# Patient Record
Sex: Male | Born: 1990 | Race: Black or African American | Hispanic: No | Marital: Single | Smoking: Current every day smoker
Health system: Southern US, Community
[De-identification: ages and names within clinical notes are randomized; demographics above are authoritative.]

## PROBLEM LIST (undated history)

## (undated) ENCOUNTER — Emergency Department

## (undated) DIAGNOSIS — T7840XA Allergy, unspecified, initial encounter: Secondary | ICD-10-CM

## (undated) DIAGNOSIS — F32A Depression, unspecified: Secondary | ICD-10-CM

## (undated) DIAGNOSIS — F419 Anxiety disorder, unspecified: Secondary | ICD-10-CM

## (undated) HISTORY — DX: Depression, unspecified: F32.A

## (undated) HISTORY — DX: Anxiety disorder, unspecified: F41.9

## (undated) HISTORY — DX: Allergy, unspecified, initial encounter: T78.40XA

---
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
## 2011-01-18 ENCOUNTER — Emergency Department: Payer: Self-pay | Admitting: Emergency Medicine

## 2012-02-10 ENCOUNTER — Emergency Department: Payer: Self-pay | Admitting: Emergency Medicine

## 2012-02-19 ENCOUNTER — Emergency Department: Payer: Self-pay | Admitting: Unknown Physician Specialty

## 2012-04-02 ENCOUNTER — Emergency Department: Payer: Self-pay | Admitting: Emergency Medicine

## 2013-06-08 ENCOUNTER — Emergency Department: Payer: Self-pay | Admitting: Emergency Medicine

## 2013-06-10 LAB — BETA STREP CULTURE(ARMC)

## 2014-09-28 ENCOUNTER — Emergency Department
Admission: EM | Admit: 2014-09-28 | Discharge: 2014-09-28 | Disposition: A | Discharge status: Home / Self Care | Payer: Self-pay | Attending: Emergency Medicine | Admitting: Emergency Medicine

## 2014-09-28 ENCOUNTER — Emergency Department: Payer: Self-pay

## 2014-09-28 ENCOUNTER — Encounter: Payer: Self-pay | Admitting: General Practice

## 2014-09-28 DIAGNOSIS — S70211A Abrasion, right hip, initial encounter: Secondary | ICD-10-CM | POA: Insufficient documentation

## 2014-09-28 DIAGNOSIS — Y9389 Activity, other specified: Secondary | ICD-10-CM | POA: Insufficient documentation

## 2014-09-28 DIAGNOSIS — S50312A Abrasion of left elbow, initial encounter: Secondary | ICD-10-CM | POA: Insufficient documentation

## 2014-09-28 DIAGNOSIS — Z720 Tobacco use: Secondary | ICD-10-CM | POA: Insufficient documentation

## 2014-09-28 DIAGNOSIS — Y998 Other external cause status: Secondary | ICD-10-CM | POA: Insufficient documentation

## 2014-09-28 DIAGNOSIS — S80812A Abrasion, left lower leg, initial encounter: Secondary | ICD-10-CM | POA: Insufficient documentation

## 2014-09-28 DIAGNOSIS — S9032XA Contusion of left foot, initial encounter: Secondary | ICD-10-CM | POA: Insufficient documentation

## 2014-09-28 DIAGNOSIS — S93602A Unspecified sprain of left foot, initial encounter: Secondary | ICD-10-CM | POA: Insufficient documentation

## 2014-09-28 DIAGNOSIS — Y9289 Other specified places as the place of occurrence of the external cause: Secondary | ICD-10-CM | POA: Insufficient documentation

## 2014-09-28 MED ORDER — IBUPROFEN 600 MG PO TABS: 600.0000 mg | ORAL_TABLET | Freq: Three times a day (TID) | ORAL | Status: DC | PRN

## 2014-09-28 MED ORDER — TRAMADOL HCL 50 MG PO TABS: 50.0000 mg | ORAL_TABLET | Freq: Three times a day (TID) | ORAL | Status: DC | PRN
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
## 2014-09-28 NOTE — Discharge Instructions (Signed)
Apply ice and elevate. Take medication as prescribed. Use crutches and Ace wrap for 2-3 days or as long as pain continues.  Follow-up with orthopedic next week for continued pain.  Return to the ER for new or worsening concerns.  Contusion A contusion is a deep bruise. Contusions are the result of an injury that caused bleeding under the skin. The contusion may turn blue, purple, or yellow. Minor injuries will give you a painless contusion, but more severe contusions may stay painful and swollen for a few weeks.  CAUSES  A contusion is usually caused by a blow, trauma, or direct force to an area of the body. SYMPTOMS   Swelling and redness of the injured area.  Bruising of the injured area.  Tenderness and soreness of the injured area.  Pain. DIAGNOSIS  The diagnosis can be made by taking a history and physical exam. An X-ray, CT scan, or MRI may be needed to determine if there were any associated injuries, such as fractures. TREATMENT  Specific treatment will depend on what area of the body was injured. In general, the best treatment for a contusion is resting, icing, elevating, and applying cold compresses to the injured area. Over-the-counter medicines may also be recommended for pain control. Ask your caregiver what the best treatment is for your contusion. HOME CARE INSTRUCTIONS   Put ice on the injured area.  Put ice in a plastic bag.  Place a towel between your skin and the bag.  Leave the ice on for 15-20 minutes, 3-4 times a day, or as directed by your health care provider.  Only take over-the-counter or prescription medicines for pain, discomfort, or fever as directed by your caregiver. Your caregiver may recommend avoiding anti-inflammatory medicines (aspirin, ibuprofen, and naproxen) for 48 hours because these medicines may increase bruising.  Rest the injured area.  If possible, elevate the injured area to reduce swelling. SEEK IMMEDIATE MEDICAL CARE IF:   You  have increased bruising or swelling.  You have pain that is getting worse.  Your swelling or pain is not relieved with medicines. MAKE SURE YOU:   Understand these instructions.  Will watch your condition.  Will get help right away if you are not doing well or get worse. Document Released: 12/16/2004 Document Revised: 03/13/2013 Document Reviewed: 01/11/2011 [facility] Patient Information 2015 [place], [place]. This information is not intended to replace advice given to you by your health care provider. Make sure you discuss any questions you have with your health care provider.  Foot Sprain The muscles and cord like structures which attach muscle to bone (tendons) that surround the feet are made up of units. A foot sprain can occur at the weakest spot in any of these units. This condition is most often caused by injury to or overuse of the foot, as from playing contact sports, or aggravating a previous injury, or from poor conditioning, or obesity. SYMPTOMS  Pain with movement of the foot.  Tenderness and swelling at the injury site.  Loss of strength is present in moderate or severe sprains. THE THREE GRADES OR SEVERITY OF FOOT SPRAIN ARE:  Mild (Grade I): Slightly pulled muscle without tearing of muscle or tendon fibers or loss of strength.  Moderate (Grade II): Tearing of fibers in a muscle, tendon, or at the attachment to bone, with small decrease in strength.  Severe (Grade III): Rupture of the muscle-tendon-bone attachment, with separation of fibers. Severe sprain requires surgical repair. Often repeating (chronic) sprains are caused by overuse.  Sudden (acute) sprains are caused by direct injury or over-use. DIAGNOSIS  Diagnosis of this condition is usually by your own observation. If problems continue, a caregiver may be required for further evaluation and treatment. X-rays may be required to make sure there are not breaks in the bones (fractures) present. Continued problems may  require physical therapy for treatment. PREVENTION  Use strength and conditioning exercises appropriate for your sport.  Warm up properly prior to working out.  Use athletic shoes that are made for the sport you are participating in.  Allow adequate time for healing. Early return to activities makes repeat injury more likely, and can lead to an unstable arthritic foot that can result in prolonged disability. Mild sprains generally heal in 3 to 10 days, with moderate and severe sprains taking 2 to 10 weeks. Your caregiver can help you determine the proper time required for healing. HOME CARE INSTRUCTIONS   Apply ice to the injury for 15-20 minutes, 03-04 times per day. Put the ice in a plastic bag and place a towel between the bag of ice and your skin.  An elastic wrap (like an Ace bandage) may be used to keep swelling down.  Keep foot above the level of the heart, or at least raised on a footstool, when swelling and pain are present.  Try to avoid use other than gentle range of motion while the foot is painful. Do not resume use until instructed by your caregiver. Then begin use gradually, not increasing use to the point of pain. If pain does develop, decrease use and continue the above measures, gradually increasing activities that do not cause discomfort, until you gradually achieve normal use.  Use crutches if and as instructed, and for the length of time instructed.  Keep injured foot and ankle wrapped between treatments.  Massage foot and ankle for comfort and to keep swelling down. Massage from the toes up towards the knee.  Only take over-the-counter or prescription medicines for pain, discomfort, or fever as directed by your caregiver. SEEK IMMEDIATE MEDICAL CARE IF:   Your pain and swelling increase, or pain is not controlled with medications.  You have loss of feeling in your foot or your foot turns cold or blue.  You develop new, unexplained symptoms, or an increase of the  symptoms that brought you to your caregiver. MAKE SURE YOU:   Understand these instructions.  Will watch your condition.  Will get help right away if you are not doing well or get worse. Document Released: 08/28/2001 Document Revised: 05/31/2011 Document Reviewed: 10/26/2007 [facility] Patient Information 2015 [place], [place]. This information is not intended to replace advice given to you by your health care provider. Make sure you discuss any questions you have with your health care provider.
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
## 2014-12-22 ENCOUNTER — Emergency Department
Admission: EM | Admit: 2014-12-22 | Discharge: 2014-12-22 | Disposition: A | Discharge status: Home / Self Care | Payer: Self-pay

## 2014-12-22 NOTE — ED Notes (Signed)
Called for patient, no answer x2

## 2014-12-22 NOTE — ED Notes (Signed)
Called for patient to be triaged, no answer at this time. 3rd time called.

## 2014-12-22 NOTE — ED Notes (Signed)
Called for patient, no answer.

## 2014-12-24 ENCOUNTER — Emergency Department
Admission: EM | Admit: 2014-12-24 | Discharge: 2014-12-24 | Disposition: A | Discharge status: Home / Self Care | Payer: Self-pay | Attending: Emergency Medicine | Admitting: Emergency Medicine

## 2014-12-24 ENCOUNTER — Encounter: Payer: Self-pay | Admitting: Emergency Medicine

## 2014-12-24 ENCOUNTER — Emergency Department: Payer: Self-pay

## 2014-12-24 DIAGNOSIS — R319 Hematuria, unspecified: Secondary | ICD-10-CM | POA: Insufficient documentation

## 2014-12-24 DIAGNOSIS — R51 Headache: Secondary | ICD-10-CM | POA: Insufficient documentation

## 2014-12-24 DIAGNOSIS — R110 Nausea: Secondary | ICD-10-CM | POA: Insufficient documentation

## 2014-12-24 DIAGNOSIS — R52 Pain, unspecified: Secondary | ICD-10-CM

## 2014-12-24 DIAGNOSIS — Z720 Tobacco use: Secondary | ICD-10-CM | POA: Insufficient documentation

## 2014-12-24 DIAGNOSIS — R519 Headache, unspecified: Secondary | ICD-10-CM

## 2014-12-24 DIAGNOSIS — R1084 Generalized abdominal pain: Secondary | ICD-10-CM | POA: Insufficient documentation

## 2014-12-24 LAB — CBC
HEMATOCRIT: 45.9 % (ref 40.0–52.0)
Hemoglobin: 14.8 g/dL (ref 13.0–18.0)
MCH: 22.6 pg — ABNORMAL LOW (ref 26.0–34.0)
MCHC: 32.2 g/dL (ref 32.0–36.0)
MCV: 70.2 fL — AB (ref 80.0–100.0)
Platelets: 350 10*3/uL (ref 150–440)
RBC: 6.54 MIL/uL — AB (ref 4.40–5.90)
RDW: 15.4 % — ABNORMAL HIGH (ref 11.5–14.5)
WBC: 11.8 10*3/uL — AB (ref 3.8–10.6)

## 2014-12-24 LAB — COMPREHENSIVE METABOLIC PANEL
ALT: 22 U/L (ref 17–63)
AST: 45 U/L — AB (ref 15–41)
Albumin: 4.7 g/dL (ref 3.5–5.0)
Alkaline Phosphatase: 63 U/L (ref 38–126)
Anion gap: 7 (ref 5–15)
BUN: 14 mg/dL (ref 6–20)
CHLORIDE: 100 mmol/L — AB (ref 101–111)
CO2: 29 mmol/L (ref 22–32)
Calcium: 9.6 mg/dL (ref 8.9–10.3)
Creatinine, Ser: 1.11 mg/dL (ref 0.61–1.24)
Glucose, Bld: 157 mg/dL — ABNORMAL HIGH (ref 65–99)
POTASSIUM: 4.3 mmol/L (ref 3.5–5.1)
Sodium: 136 mmol/L (ref 135–145)
Total Bilirubin: 2.2 mg/dL — ABNORMAL HIGH (ref 0.3–1.2)
Total Protein: 7.7 g/dL (ref 6.5–8.1)

## 2014-12-24 LAB — URINALYSIS COMPLETE WITH MICROSCOPIC (ARMC ONLY)
BACTERIA UA: NONE SEEN
Bilirubin Urine: NEGATIVE
Glucose, UA: NEGATIVE mg/dL
LEUKOCYTES UA: NEGATIVE
Nitrite: NEGATIVE
PH: 6 (ref 5.0–8.0)
Protein, ur: 30 mg/dL — AB
Specific Gravity, Urine: 1.028 (ref 1.005–1.030)

## 2014-12-24 LAB — LIPASE, BLOOD: LIPASE: 19 U/L — AB (ref 22–51)

## 2014-12-24 NOTE — Discharge Instructions (Signed)
Abdominal Pain Many things can cause abdominal pain. Usually, abdominal pain is not caused by a disease and will improve without treatment. It can often be observed and treated at home. Your health care provider will do a physical exam and possibly order blood tests and X-rays to help determine the seriousness of your pain. However, in many cases, more time must pass before a clear cause of the pain can be found. Before that point, your health care provider may not know if you need more testing or further treatment. HOME CARE INSTRUCTIONS  Monitor your abdominal pain for any changes. The following actions may help to alleviate any discomfort you are experiencing:  Only take over-the-counter or prescription medicines as directed by your health care provider.  Do not take laxatives unless directed to do so by your health care provider.  Try a clear liquid diet (broth, tea, or water) as directed by your health care provider. Slowly move to a bland diet as tolerated. SEEK MEDICAL CARE IF:  You have unexplained abdominal pain.  You have abdominal pain associated with nausea or diarrhea.  You have pain when you urinate or have a bowel movement.  You experience abdominal pain that wakes you in the night.  You have abdominal pain that is worsened or improved by eating food.  You have abdominal pain that is worsened with eating fatty foods.  You have a fever. SEEK IMMEDIATE MEDICAL CARE IF:   Your pain does not go away within 2 hours.  You keep throwing up (vomiting).  Your pain is felt only in portions of the abdomen, such as the right side or the left lower portion of the abdomen.  You pass bloody or black tarry stools. MAKE SURE YOU:  Understand these instructions.   Will watch your condition.   Will get help right away if you are not doing well or get worse.  Document Released: 12/16/2004 Document Revised: 03/13/2013 Document Reviewed: 11/15/2012 [facility] Patient Information  2015 [place], [place]. This information is not intended to replace advice given to you by your health care provider. Make sure you discuss any questions you have with your health care provider. Please return for fever vomiting worse pain or burning when you urinate or any other complaints. Please remember to follow-up with the urologist to evaluate the blood in your urine that should not be there especially your age. Use Tylenol for the time being for the headache and other pains.

## 2014-12-24 NOTE — ED Notes (Signed)
Pt to ED with c/o headache, nausea and generalized abd. Pain since yesterday

## 2014-12-24 NOTE — ED Provider Notes (Signed)
[facility] Emergency Department Provider Note  ____________________________________________  Time seen: Approximately 2:50 PM  I have reviewed the triage vital signs and the nursing notes.   HISTORY  Chief Complaint Abdominal Pain and Headache    HPI George Barrera is a 24 y.o. male who reports he had a throbbing frontal headache for the last 3 days. It is not changed by position he doesn't have any stuffy nose just came on and is been fairly mild to moderate. Patient reports he does not usually have headaches although has occasional headaches. This headache is not severe. Patient does not feel he had a fever although his wife said he felt warm last night. Patient also has a complaint of diffuse abdominal pain and nausea. That came on this morning.Patient denies any other complaints abdominal pain is moderate in nature crampy as I said does not appear to change with any activity or eating. Patient reports he really hasn't eaten anything except for very small meal for breakfast yesterday.   History reviewed. No pertinent past medical history.  There are no active problems to display for this patient.   History reviewed. No pertinent past surgical history.  Current Outpatient Rx  Name  Route  Sig  Dispense  Refill  . ibuprofen (ADVIL,MOTRIN) 600 MG tablet   Oral   Take 1 tablet (600 mg total) by mouth every 8 (eight) hours as needed for mild pain or moderate pain.   15 tablet   0   . traMADol (ULTRAM) 50 MG tablet   Oral   Take 1 tablet (50 mg total) by mouth every 8 (eight) hours as needed (Do not drive or operate machinery while taking as can cause drowsiness.).   12 tablet   0     Allergies Review of patient's allergies indicates no known allergies.  No family history on file.  Social History Social History  Substance Use Topics  . Smoking status: Current Every Day Smoker -- 0.50 packs/day    Types: Cigarettes  . Smokeless tobacco: None   . Alcohol Use: No    Review of Systems Constitutional: No fever/chills see history of present illness Eyes: No visual changes. ENT: No sore throat. Cardiovascular: Denies chest pain. Respiratory: Denies shortness of breath. Gastrointestinal: no vomiting.  No diarrhea.  No constipation. Genitourinary: Negative for dysuria. Musculoskeletal: Negative for back pain. Skin: Negative for rash. Neurological: Negative for headaches, focal weakness or numbness.  10-point ROS otherwise negative.  ____________________________________________   PHYSICAL EXAM:  VITAL SIGNS: ED Triage Vitals  Enc Vitals Group     BP 12/24/14 1344 135/110 mmHg     Pulse Rate 12/24/14 1344 105     Resp 12/24/14 1344 18     Temp 12/24/14 1344 98.1 F (36.7 C)     Temp Source 12/24/14 1344 Oral     SpO2 12/24/14 1344 99 %     Weight 12/24/14 1344 145 lb (65.772 kg)     Height 12/24/14 1344  (1.778 m)     Head Cir --      Peak Flow --      Pain Score 12/24/14 1345 6     Pain Loc --      Pain Edu? --      Excl. in GC? --     Constitutional: Alert and oriented. Well appearing and in no acute distress. Eyes: Conjunctivae are normal. PERRL. EOMI. fundi look normal bilaterally Head: Atraumatic. Nose: No congestion/rhinnorhea. Mouth/Throat: Mucous membranes are moist.  Oropharynx non-erythematous. Neck: No stridor.   Cardiovascular: Normal rate, regular rhythm. Grossly normal heart sounds.  Good peripheral circulation. Respiratory: Normal respiratory effort.  No retractions. Lungs CTAB. Gastrointestinal: Soft and nontender. No distention. No abdominal bruits. No CVA tenderness. Musculoskeletal: No lower extremity tenderness nor edema.  No joint effusions. Neurologic:  Normal speech and language. No gross focal neurologic deficits are appreciated. No gait instability. Skin:  Skin is warm, dry and intact. No rash noted. There is no abrasion on the right shoulder blade patient reports he got that from  playing with his dog. Psychiatric: Mood and affect are normal. Speech and behavior are normal.  ____________________________________________   LABS (all labs ordered are listed, but only abnormal results are displayed)  Labs Reviewed  COMPREHENSIVE METABOLIC PANEL - Abnormal; Notable for the following:    Chloride 100 (*)    Glucose, Bld 157 (*)    AST 45 (*)    Total Bilirubin 2.2 (*)    All other components within normal limits  CBC - Abnormal; Notable for the following:    WBC 11.8 (*)    RBC 6.54 (*)    MCV 70.2 (*)    MCH 22.6 (*)    RDW 15.4 (*)    All other components within normal limits  URINALYSIS COMPLETEWITH MICROSCOPIC ([facility] ONLY) - Abnormal; Notable for the following:    Color, Urine YELLOW (*)    APPearance CLEAR (*)    Ketones, ur 1+ (*)    Hgb urine dipstick 3+ (*)    Protein, ur 30 (*)    Squamous Epithelial / LPF 0-5 (*)    All other components within normal limits  LIPASE, BLOOD - Abnormal; Notable for the following:    Lipase 19 (*)    All other components within normal limits   ____________________________________________  EKG   ____________________________________________  RADIOLOGY   ____________________________________________   PROCEDURES   ____________________________________________   INITIAL IMPRESSION / ASSESSMENT AND PLAN / ED COURSE  Pertinent labs & imaging results that were available during my care of the patient were reviewed by me and considered in my medical decision making (see chart for details). Discussed in detail with the patient need to follow-up for hematuria I also discussed with him and his significant other the need to have his blood pressure followed because it was considerably higher than it should've and in triage although here it's been in the 140s last reading was 143/78. Patient's fundi look well not sure why he is having hematuria wouldn't expect hematuria with blood pressure in the 140s hypertension,  Especially with a normal creatinine. Patient's headache is gone after he ate.  ____________________________________________   FINAL CLINICAL IMPRESSION(S) / ED DIAGNOSES  Final diagnoses:  Pain  Nonintractable episodic headache, unspecified headache type  Hematuria      Arnaldo Natal, MD 12/24/14 [phone]

## 2014-12-28 ENCOUNTER — Encounter: Payer: Self-pay | Admitting: *Deleted

## 2014-12-28 ENCOUNTER — Emergency Department
Admission: EM | Admit: 2014-12-28 | Discharge: 2014-12-29 | Disposition: A | Discharge status: Home / Self Care | Payer: Self-pay | Attending: Emergency Medicine | Admitting: Emergency Medicine

## 2014-12-28 DIAGNOSIS — R519 Headache, unspecified: Secondary | ICD-10-CM

## 2014-12-28 DIAGNOSIS — R110 Nausea: Secondary | ICD-10-CM | POA: Insufficient documentation

## 2014-12-28 DIAGNOSIS — R51 Headache: Secondary | ICD-10-CM | POA: Insufficient documentation

## 2014-12-28 DIAGNOSIS — Z720 Tobacco use: Secondary | ICD-10-CM | POA: Insufficient documentation

## 2014-12-28 DIAGNOSIS — R319 Hematuria, unspecified: Secondary | ICD-10-CM | POA: Insufficient documentation

## 2014-12-28 DIAGNOSIS — M549 Dorsalgia, unspecified: Secondary | ICD-10-CM | POA: Insufficient documentation

## 2014-12-28 LAB — URINALYSIS COMPLETE WITH MICROSCOPIC (ARMC ONLY)
Bilirubin Urine: NEGATIVE
Glucose, UA: NEGATIVE mg/dL
Ketones, ur: NEGATIVE mg/dL
Leukocytes, UA: NEGATIVE
Nitrite: NEGATIVE
PROTEIN: NEGATIVE mg/dL
Specific Gravity, Urine: 1.02 (ref 1.005–1.030)
WBC, UA: NONE SEEN WBC/hpf (ref 0–5)
pH: 6 (ref 5.0–8.0)

## 2014-12-28 MED ORDER — PROMETHAZINE HCL 25 MG PO TABS
12.5000 mg | ORAL_TABLET | Freq: Once | ORAL | Status: AC
  Administered 2014-12-28: 12.5 mg via ORAL
  Filled 2014-12-28: qty 1

## 2014-12-28 MED ORDER — OXYCODONE-ACETAMINOPHEN 5-325 MG PO TABS
1.0000 | ORAL_TABLET | Freq: Once | ORAL | Status: AC
Start: 2014-12-28 — End: 2014-12-28
  Administered 2014-12-28: 1 via ORAL
  Filled 2014-12-28: qty 1

## 2014-12-28 NOTE — ED Notes (Signed)
Pt reports headache and back pain since Sunday. Tried OTC Tylenol without relief.

## 2014-12-29 MED ORDER — PROMETHAZINE HCL 12.5 MG PO TABS: 12.5000 mg | ORAL_TABLET | Freq: Three times a day (TID) | ORAL | Status: DC | PRN

## 2014-12-29 NOTE — Discharge Instructions (Signed)
General Headache Without Cause A headache is pain or discomfort felt around the head or neck area. There are many causes and types of headaches. In some cases, the cause may not be found.  HOME CARE  Managing Pain  Take over-the-counter and prescription medicines only as told by your doctor.  Lie down in a dark, quiet room when you have a headache.  If directed, apply ice to the head and neck area:  Put ice in a plastic bag.  Place a towel between your skin and the bag.  Leave the ice on for 20 minutes, 2-3 times per day.  Use a heating pad or hot shower to apply heat to the head and neck area as told by your doctor.  Keep lights dim if bright lights bother you or make your headaches worse. Eating and Drinking  Eat meals on a regular schedule.  Lessen how much alcohol you drink.  Lessen how much caffeine you drink, or stop drinking caffeine. General Instructions  Keep all follow-up visits as told by your doctor. This is important.  Keep a journal to find out if certain things bring on headaches. For example, write down:  What you eat and drink.  How much sleep you get.  Any change to your diet or medicines.  Relax by getting a massage or doing other relaxing activities.  Lessen stress.  Sit up straight. Do not tighten (tense) your muscles.  Do not use tobacco products. This includes cigarettes, chewing tobacco, or e-cigarettes. If you need help quitting, ask your doctor.  Exercise regularly as told by your doctor.  Get enough sleep. This often means 7-9 hours of sleep. GET HELP IF:  Your symptoms are not helped by medicine.  You have a headache that feels different than the other headaches.  You feel sick to your stomach (nauseous) or you throw up (vomit).  You have a fever. GET HELP RIGHT AWAY IF:   Your headache becomes really bad.  You keep throwing up.  You have a stiff neck.  You have trouble seeing.  You have trouble speaking.  You have  pain in the eye or ear.  Your muscles are weak or you lose muscle control.  You lose your balance or have trouble walking.  You feel like you will pass out (faint) or you pass out.  You have confusion.   This information is not intended to replace advice given to you by your health care provider. Make sure you discuss any questions you have with your health care provider.   Document Released: 12/16/2007 Document Revised: 11/27/2014 Document Reviewed: 07/01/2014 Elsevier Interactive Patient Education 2016 [employer]  Hematuria, Adult Hematuria is blood in your urine. It can be caused by a bladder infection, kidney infection, prostate infection, kidney stone, or cancer of your urinary tract. Infections can usually be treated with medicine, and a kidney stone usually will pass through your urine. If neither of these is the cause of your hematuria, further workup to find out the reason may be needed. It is very important that you tell your health care provider about any blood you see in your urine, even if the blood stops without treatment or happens without causing pain. Blood in your urine that happens and then stops and then happens again can be a symptom of a very serious condition. Also, pain is not a symptom in the initial stages of many urinary cancers. HOME CARE INSTRUCTIONS   Drink lots of fluid, 3-4 quarts a day.  If you have been diagnosed with an infection, cranberry juice is especially recommended, in addition to large amounts of water.  Avoid caffeine, tea, and carbonated beverages because they tend to irritate the bladder.  Avoid alcohol because it may irritate the prostate.  Take all medicines as directed by your health care provider.  If you were prescribed an antibiotic medicine, finish it all even if you start to feel better.  If you have been diagnosed with a kidney stone, follow your health care provider's instructions regarding straining your urine to catch the  stone.  Empty your bladder often. Avoid holding urine for long periods of time.  After a bowel movement, women should cleanse front to back. Use each tissue only once.  Empty your bladder before and after sexual intercourse if you are a male. SEEK MEDICAL CARE IF:  You develop back pain.  You have a fever.  You have a feeling of sickness in your stomach (nausea) or vomiting.  Your symptoms are not better in 3 days. Return sooner if you are getting worse. SEEK IMMEDIATE MEDICAL CARE IF:   You develop severe vomiting and are unable to keep the medicine down.  You develop severe back or abdominal pain despite taking your medicines.  You begin passing a large amount of blood or clots in your urine.  You feel extremely weak or faint, or you pass out. MAKE SURE YOU:   Understand these instructions.  Will watch your condition.  Will get help right away if you are not doing well or get worse.   This information is not intended to replace advice given to you by your health care provider. Make sure you discuss any questions you have with your health care provider.   Document Released: 03/08/2005 Document Revised: 03/29/2014 Document Reviewed: 11/06/2012 Elsevier Interactive Patient Education [employer]

## 2014-12-29 NOTE — ED Provider Notes (Signed)
CSN: [id number]     Arrival date & time 12/28/14  2214 History   First MD Initiated Contact with Patient 12/28/14 2301     Chief Complaint  Patient presents with  . Headache  . Back Pain     (Consider location/radiation/quality/duration/timing/severity/associated sxs/prior Treatment) HPI  24 year old male presents to the emergency department for evaluation of headache with blood in urine. He was evaluated at the emergency department 4 days ago, CBC, BMP CT of the head, CT abdomen pelvis for stones were all negative. Urinalysis did show hematuria with no infection. Today, patient complains of continued headache, blood in urine as well as lower back pain. Patient states hematuria and back pain have been improving.  His headache is moderate and is his chief complaint. Headache is frontal, described as throbbing. He has a history of migraine headaches. No relief with Tylenol. He denies any worsening headache symptoms over the last 4 days. Mild nausea without vomiting. He is tolerating by mouth well. Drinking lots of fluids. He denies any fevers, neck pain, abdominal pain, no penal discharge.  History reviewed. No pertinent past medical history. History reviewed. No pertinent past surgical history. No family history on file. Social History  Substance Use Topics  . Smoking status: Current Every Day Smoker -- 0.50 packs/day    Types: Cigarettes  . Smokeless tobacco: None  . Alcohol Use: No    Review of Systems  Constitutional: Negative.  Negative for fever, chills, activity change and appetite change.  HENT: Negative for congestion, ear pain, mouth sores, rhinorrhea, sinus pressure, sore throat and trouble swallowing.   Eyes: Negative for photophobia, pain and discharge.  Respiratory: Negative for cough, chest tightness and shortness of breath.   Cardiovascular: Negative for chest pain and leg swelling.  Gastrointestinal: Positive for nausea. Negative for vomiting, abdominal pain, diarrhea  and abdominal distention.  Genitourinary: Positive for hematuria. Negative for dysuria, urgency, frequency, flank pain, decreased urine volume, discharge, difficulty urinating and testicular pain.  Musculoskeletal: Negative for back pain, arthralgias and gait problem.  Skin: Negative for color change and rash.  Neurological: Positive for headaches. Negative for dizziness.  Hematological: Negative for adenopathy.  Psychiatric/Behavioral: Negative for behavioral problems and agitation.      Allergies  Review of patient's allergies indicates no known allergies.  Home Medications   Prior to Admission medications   Medication Sig Start Date End Date Taking? Authorizing Provider  ibuprofen (ADVIL,MOTRIN) 600 MG tablet Take 1 tablet (600 mg total) by mouth every 8 (eight) hours as needed for mild pain or moderate pain. 09/28/14   Renford Dills, NP  promethazine (PHENERGAN) 12.5 MG tablet Take 1 tablet (12.5 mg total) by mouth every 8 (eight) hours as needed for nausea or vomiting. 12/29/14   Evon Slack, PA-C  traMADol (ULTRAM) 50 MG tablet Take 1 tablet (50 mg total) by mouth every 8 (eight) hours as needed (Do not drive or operate machinery while taking as can cause drowsiness.). 09/28/14   Renford Dills, NP   BP 148/86 mmHg  Pulse 86  Temp(Src) 97.6 F (36.4 C) (Oral)  Resp 16  Ht  (1.753 m)  Wt 146 lb (66.225 kg)  BMI 21.55 kg/m2  SpO2 100% Physical Exam  Constitutional: He is oriented to person, place, and time. He appears well-developed and well-nourished.  HENT:  Head: Normocephalic and atraumatic.  Eyes: Conjunctivae and EOM are normal. Pupils are equal, round, and reactive to light.  Neck: Normal range of motion. Neck supple.  Cardiovascular: Normal  rate, regular rhythm, normal heart sounds and intact distal pulses.   Pulmonary/Chest: Effort normal and breath sounds normal. No respiratory distress. He has no wheezes. He has no rales. He exhibits no tenderness.   Abdominal: Soft. Bowel sounds are normal. He exhibits no distension and no mass. There is no tenderness. There is no guarding.  Musculoskeletal: Normal range of motion. He exhibits no edema or tenderness.  Neurological: He is alert and oriented to person, place, and time. No cranial nerve deficit. Coordination normal.  Skin: Skin is warm and dry.  Psychiatric: He has a normal mood and affect. His behavior is normal. Judgment and thought content normal.    ED Course  Procedures (including critical care time) Labs Review Labs Reviewed  URINALYSIS COMPLETEWITH MICROSCOPIC ([facility] ONLY) - Abnormal; Notable for the following:    Color, Urine YELLOW (*)    APPearance HAZY (*)    Hgb urine dipstick 2+ (*)    Bacteria, UA RARE (*)    Squamous Epithelial / LPF 0-5 (*)    All other components within normal limits    Imaging Review No results found. I have personally reviewed and evaluated these images and lab results as part of my medical decision-making.   EKG Interpretation None      MDM   Final diagnoses:  Hematuria  Nonintractable headache, unspecified chronicity pattern, unspecified headache type    24 year old male since the emergency department for continued headache and hematuria has been present for 1 week. He was evaluated 4 days ago here at the emergency department where CT of the head, CT of the abdomen and pelvis for stones, CBC, BMP, UA were obtained. Urinalysis showed hematuria that has improved based on today's urinalysis. Patient was given 12.5 mg of fever and by mouth, headache improved to 3 out of 10 at time of discharge. Patient requested work note for the next 2 days. He was urged to follow-up with urology first thing Monday morning. If any worsening symptoms or urgent changes in the patient's health, patient is to return to the ER.    Evon Slack, PA-C 12/29/14 1610  Rockne Menghini, MD 12/29/14 1901

## 2015-09-30 ENCOUNTER — Emergency Department
Admission: EM | Admit: 2015-09-30 | Discharge: 2015-09-30 | Disposition: A | Discharge status: Home / Self Care | Payer: Self-pay | Attending: Emergency Medicine | Admitting: Emergency Medicine

## 2015-09-30 DIAGNOSIS — J039 Acute tonsillitis, unspecified: Secondary | ICD-10-CM | POA: Insufficient documentation

## 2015-09-30 DIAGNOSIS — J020 Streptococcal pharyngitis: Secondary | ICD-10-CM

## 2015-09-30 DIAGNOSIS — F17210 Nicotine dependence, cigarettes, uncomplicated: Secondary | ICD-10-CM | POA: Insufficient documentation

## 2015-09-30 LAB — POCT RAPID STREP A: STREPTOCOCCUS, GROUP A SCREEN (DIRECT): POSITIVE — AB

## 2015-09-30 MED ORDER — LIDOCAINE VISCOUS 2 % MT SOLN
OROMUCOSAL | Status: AC
  Administered 2015-09-30: 15 mL via OROMUCOSAL
  Filled 2015-09-30: qty 15

## 2015-09-30 MED ORDER — NAPROXEN 500 MG PO TABS: 500.0000 mg | ORAL_TABLET | Freq: Two times a day (BID) | ORAL | Status: DC

## 2015-09-30 MED ORDER — LIDOCAINE VISCOUS 2 % MT SOLN: 20.0000 mL | OROMUCOSAL | Status: DC | PRN

## 2015-09-30 MED ORDER — AMOXICILLIN 500 MG PO TABS: 500.0000 mg | ORAL_TABLET | Freq: Two times a day (BID) | ORAL | Status: DC

## 2015-09-30 MED ORDER — AMOXICILLIN 500 MG PO CAPS
ORAL_CAPSULE | ORAL | Status: AC
  Administered 2015-09-30: 500 mg via ORAL
  Filled 2015-09-30: qty 1

## 2015-09-30 MED ORDER — LIDOCAINE VISCOUS 2 % MT SOLN
15.0000 mL | Freq: Once | OROMUCOSAL | Status: AC
  Administered 2015-09-30: 15 mL via OROMUCOSAL

## 2015-09-30 MED ORDER — AMOXICILLIN 500 MG PO CAPS
500.0000 mg | ORAL_CAPSULE | Freq: Once | ORAL | Status: AC
  Administered 2015-09-30: 500 mg via ORAL

## 2015-09-30 NOTE — Discharge Instructions (Signed)

## 2015-09-30 NOTE — ED Provider Notes (Signed)
[facility] Emergency Department Provider Note  ____________________________________________  Time seen: Approximately 12:27 PM  I have reviewed the triage vital signs and the nursing notes.   HISTORY  Chief Complaint Sore Throat    HPI George Barrera is a 25 y.o. male presents with complaints of sore throat for the last 2 days. Denies any fever chills nausea vomiting. Appetite is okay just hurts to swallow. Patient is an every day smoker.   History reviewed. No pertinent past medical history.  There are no active problems to display for this patient.   History reviewed. No pertinent past surgical history.  Current Outpatient Rx  Name  Route  Sig  Dispense  Refill  . amoxicillin (AMOXIL) 500 MG tablet   Oral   Take 1 tablet (500 mg total) by mouth 2 (two) times daily.   20 tablet   0   . lidocaine (XYLOCAINE) 2 % solution   Mouth/Throat   Use as directed 20 mLs in the mouth or throat as needed for mouth pain.   100 mL   0   . naproxen (NAPROSYN) 500 MG tablet   Oral   Take 1 tablet (500 mg total) by mouth 2 (two) times daily with a meal.   60 tablet   0     Allergies Review of patient's allergies indicates no known allergies.  No family history on file.  Social History Social History  Substance Use Topics  . Smoking status: Current Every Day Smoker -- 0.50 packs/day    Types: Cigarettes  . Smokeless tobacco: None  . Alcohol Use: No    Review of Systems Constitutional: No fever/chills Eyes: No visual changes. ENT: Positive sore throat. Cardiovascular: Denies chest pain. Respiratory: Denies shortness of breath. Musculoskeletal: Negative for back pain. Skin: Negative for rash. Neurological: Negative for headaches, focal weakness or numbness.  10-point ROS otherwise negative.  ____________________________________________   PHYSICAL EXAM:  VITAL SIGNS: ED Triage Vitals  Enc Vitals Group     BP 09/30/15 1221 147/90  mmHg     Pulse Rate 09/30/15 1221 93     Resp 09/30/15 1221 20     Temp 09/30/15 1221 98.6 F (37 C)     Temp Source 09/30/15 1221 Oral     SpO2 09/30/15 1221 97 %     Weight --      Height --      Head Cir --      Peak Flow --      Pain Score 09/30/15 1220 10     Pain Loc --      Pain Edu? --      Excl. in GC? --     Constitutional: Alert and oriented. Well appearing and in no acute distress. Head: Atraumatic. Nose: No congestion/rhinnorhea. Mouth/Throat: Mucous membranes are moist.  Oropharynx non-erythematous. Neck: No stridor. Full range of motion nontender  Cardiovascular: Normal rate, regular rhythm. Grossly normal heart sounds.  Good peripheral circulation. Respiratory: Normal respiratory effort.  No retractions. Lungs CTAB. Musculoskeletal: No lower extremity tenderness nor edema.  No joint effusions. Neurologic:  Normal speech and language. No gross focal neurologic deficits are appreciated. No gait instability. Skin:  Skin is warm, dry and intact. No rash noted. Psychiatric: Mood and affect are normal. Speech and behavior are normal.  ____________________________________________   LABS (all labs ordered are listed, but only abnormal results are displayed)  Labs Reviewed  POCT RAPID STREP A - Abnormal; Notable for the following:  Streptococcus, Group A Screen (Direct) POSITIVE (*)    All other components within normal limits   ____________________________________________  EKG   ____________________________________________  RADIOLOGY   ____________________________________________   PROCEDURES  Procedure(s) performed: None  Critical Care performed: No  ____________________________________________   INITIAL IMPRESSION / ASSESSMENT AND PLAN / ED COURSE  Pertinent labs & imaging results that were available during my care of the patient were reviewed by me and considered in my medical decision making (see chart for details).  Acute exudative  tonsillitis. Rx given for amoxicillin 500 mg 3 times a day, viscous lidocaine. Work excuse 24 hours patient follow-up with PCP or return to ER with any worsening symptomology. ____________________________________________   FINAL CLINICAL IMPRESSION(S) / ED DIAGNOSES  Final diagnoses:  Tonsillitis with exudate  Strep sore throat     This chart was dictated using voice recognition software/Dragon. Despite best efforts to proofread, errors can occur which can change the meaning. Any change was purely unintentional.   Evangeline Dakin, PA-C 09/30/15 1443  Jene Every, MD 09/30/15 [phone]

## 2015-09-30 NOTE — ED Notes (Signed)
Pt c/o sore throat for the past 2 days 

## 2020-05-25 ENCOUNTER — Emergency Department: Payer: Self-pay

## 2020-05-25 ENCOUNTER — Emergency Department
Admission: EM | Admit: 2020-05-25 | Discharge: 2020-05-25 | Disposition: A | Discharge status: Home / Self Care | Payer: Self-pay | Attending: Emergency Medicine | Admitting: Emergency Medicine

## 2020-05-25 ENCOUNTER — Other Ambulatory Visit: Payer: Self-pay

## 2020-05-25 DIAGNOSIS — F17210 Nicotine dependence, cigarettes, uncomplicated: Secondary | ICD-10-CM | POA: Insufficient documentation

## 2020-05-25 DIAGNOSIS — R112 Nausea with vomiting, unspecified: Secondary | ICD-10-CM | POA: Insufficient documentation

## 2020-05-25 DIAGNOSIS — R1033 Periumbilical pain: Secondary | ICD-10-CM | POA: Insufficient documentation

## 2020-05-25 DIAGNOSIS — R197 Diarrhea, unspecified: Secondary | ICD-10-CM | POA: Insufficient documentation

## 2020-05-25 LAB — LIPASE, BLOOD: Lipase: 24 U/L (ref 11–51)

## 2020-05-25 LAB — COMPREHENSIVE METABOLIC PANEL
ALT: 14 U/L (ref 0–44)
AST: 24 U/L (ref 15–41)
Albumin: 4.3 g/dL (ref 3.5–5.0)
Alkaline Phosphatase: 59 U/L (ref 38–126)
Anion gap: 9 (ref 5–15)
BUN: 14 mg/dL (ref 6–20)
CO2: 23 mmol/L (ref 22–32)
Calcium: 9 mg/dL (ref 8.9–10.3)
Chloride: 105 mmol/L (ref 98–111)
Creatinine, Ser: 0.94 mg/dL (ref 0.61–1.24)
GFR, Estimated: >60 mL/min (ref >60)
Glucose, Bld: 109 mg/dL — ABNORMAL HIGH (ref 70–99)
Potassium: 3.9 mmol/L (ref 3.5–5.1)
Sodium: 137 mmol/L (ref 135–145)
Total Bilirubin: 1.3 mg/dL — ABNORMAL HIGH (ref 0.3–1.2)
Total Protein: 7.6 g/dL (ref 6.5–8.1)

## 2020-05-25 LAB — CBC
HCT: 48.5 % (ref 39.0–52.0)
Hemoglobin: 16.6 g/dL (ref 13.0–17.0)
MCH: 23.3 pg — ABNORMAL LOW (ref 26.0–34.0)
MCHC: 34.2 g/dL (ref 30.0–36.0)
MCV: 68.2 fL — ABNORMAL LOW (ref 80.0–100.0)
Platelets: 308 10*3/uL (ref 150–400)
RBC: 7.11 MIL/uL — ABNORMAL HIGH (ref 4.22–5.81)
RDW: 17.3 % — ABNORMAL HIGH (ref 11.5–15.5)
WBC: 17.8 10*3/uL — ABNORMAL HIGH (ref 4.0–10.5)
nRBC: 0 % (ref 0.0–0.2)

## 2020-05-25 MED ORDER — SUCRALFATE 1 G PO TABS: 1.0000 g | ORAL_TABLET | Freq: Four times a day (QID) | ORAL | 0 refills | Status: DC

## 2020-05-25 MED ORDER — IOHEXOL 300 MG/ML  SOLN
100.0000 mL | Freq: Once | INTRAMUSCULAR | Status: AC | PRN
  Administered 2020-05-25: 100 mL via INTRAVENOUS

## 2020-05-25 MED ORDER — SODIUM CHLORIDE 0.9 % IV BOLUS
1000.0000 mL | Freq: Once | INTRAVENOUS | Status: AC
  Administered 2020-05-25: 1000 mL via INTRAVENOUS

## 2020-05-25 MED ORDER — PANTOPRAZOLE SODIUM 20 MG PO TBEC: 20.0000 mg | DELAYED_RELEASE_TABLET | Freq: Every day | ORAL | 1 refills | Status: DC

## 2020-05-25 MED ORDER — ONDANSETRON HCL 4 MG/2ML IJ SOLN
4.0000 mg | Freq: Once | INTRAMUSCULAR | Status: AC
  Administered 2020-05-25: 4 mg via INTRAVENOUS
  Filled 2020-05-25: qty 2

## 2020-05-25 MED ORDER — KETOROLAC TROMETHAMINE 30 MG/ML IJ SOLN
30.0000 mg | Freq: Once | INTRAMUSCULAR | Status: AC
  Administered 2020-05-25: 30 mg via INTRAVENOUS
  Filled 2020-05-25: qty 1

## 2020-05-25 NOTE — ED Provider Notes (Signed)
[facility] Emergency Department Provider Note   ____________________________________________   Event Date/Time   First MD Initiated Contact with Patient 05/25/20 1423     (approximate)  I have reviewed the triage vital signs and the nursing notes.   HISTORY  Chief Complaint Abdominal Pain    HPI George Barrera is a 30 y.o. male with no stated past medical history who presents complaining of periumbilical as well as generalized abdominal pain that has been present over the last 48 hours and is associated with acid reflux pain and vomiting.  Patient is concerned as he states that he was told 1 year ago while in prison that he had an H. pylori infection from the water but does not recall whether he was ever treated.  Patient states that the symptoms feel similar to the symptoms he had when he was in jail.  Patient describes an aching/burning, periumbilical pain that does not radiate and is not associated with any constipation or decreased bowel movements.  Patient states he had a bowel yesterday that was somewhat loose.  Patient states that he has vomited approximately 5-6 times that is nonbloody today.  Patient denies any recent food out of the ordinary/sick contacts or travel.  Patient currently denies any vision changes, tinnitus, difficulty speaking, facial droop, sore throat, chest pain, shortness of breath, diarrhea, dysuria, or weakness/numbness/paresthesias in any extremity         History reviewed. No pertinent past medical history.  There are no problems to display for this patient.   History reviewed. No pertinent surgical history.  Prior to Admission medications   Medication Sig Start Date End Date Taking? Authorizing Provider  amoxicillin (AMOXIL) 500 MG tablet Take 1 tablet (500 mg total) by mouth 2 (two) times daily. 09/30/15   Beers, Charmayne Sheer, PA-C  lidocaine (XYLOCAINE) 2 % solution Use as directed 20 mLs in the mouth or throat as needed  for mouth pain. 09/30/15   Beers, Charmayne Sheer, PA-C  naproxen (NAPROSYN) 500 MG tablet Take 1 tablet (500 mg total) by mouth 2 (two) times daily with a meal. 09/30/15   Beers, Charmayne Sheer, PA-C  promethazine (PHENERGAN) 12.5 MG tablet Take 1 tablet (12.5 mg total) by mouth every 8 (eight) hours as needed for nausea or vomiting. 12/29/14 09/30/15  Evon Slack, PA-C    Allergies Patient has no known allergies.  No family history on file.  Social History Social History   Tobacco Use  . Smoking status: Current Every Day Smoker    Packs/day: 0.50    Types: Cigarettes  Substance Use Topics  . Alcohol use: No  . Drug use: No    Review of Systems Constitutional: No fever/chills Eyes: No visual changes. ENT: No sore throat. Cardiovascular: Denies chest pain. Respiratory: Denies shortness of breath. Gastrointestinal: Endorses abdominal pain nausea/vomiting.  No diarrhea. Genitourinary: Negative for dysuria. Musculoskeletal: Negative for acute arthralgias Skin: Negative for rash. Neurological: Negative for headaches, weakness/numbness/paresthesias in any extremity Psychiatric: Negative for suicidal ideation/homicidal ideation   ____________________________________________   PHYSICAL EXAM:  VITAL SIGNS: ED Triage Vitals  Enc Vitals Group     BP 05/25/20 1250 (!) 141/77     Pulse Rate 05/25/20 1250 (!) 101     Resp 05/25/20 1250 18     Temp 05/25/20 1250 98.3 F (36.8 C)     Temp Source 05/25/20 1250 Oral     SpO2 05/25/20 1250 94 %     Weight 05/25/20 1251 150 lb (  68 kg)     Height 05/25/20 1251 5\' 10"  (1.778 m)     Head Circumference --      Peak Flow --      Pain Score 05/25/20 1251 8     Pain Loc --      Pain Edu? --      Excl. in GC? --    Constitutional: Alert and oriented. Well appearing and in no acute distress. Eyes: Conjunctivae are normal. PERRL. Head: Atraumatic. Nose: No congestion/rhinnorhea. Mouth/Throat: Mucous membranes are moist. Neck: No  stridor Cardiovascular: Grossly normal heart sounds.  Good peripheral circulation. Respiratory: Normal respiratory effort.  No retractions. Gastrointestinal: Soft and mild tenderness palpation in the periumbilical region. No distention. Musculoskeletal: No obvious deformities Neurologic:  Normal speech and language. No gross focal neurologic deficits are appreciated. Skin:  Skin is warm and dry. No rash noted. Psychiatric: Mood and affect are normal. Speech and behavior are normal.  ____________________________________________   LABS (all labs ordered are listed, but only abnormal results are displayed)  Labs Reviewed  COMPREHENSIVE METABOLIC PANEL - Abnormal; Notable for the following components:      Result Value   Glucose, Bld 109 (*)    Total Bilirubin 1.3 (*)    All other components within normal limits  CBC - Abnormal; Notable for the following components:   WBC 17.8 (*)    RBC 7.11 (*)    MCV 68.2 (*)    MCH 23.3 (*)    RDW 17.3 (*)    All other components within normal limits  LIPASE, BLOOD  URINALYSIS, COMPLETE (UACMP) WITH MICROSCOPIC    RADIOLOGY  ED MD interpretation: Pending at shift change  Official radiology report(s): No results found.  ____________________________________________   PROCEDURES  Procedure(s) performed (including Critical Care):  .1-3 Lead EKG Interpretation Performed by: 07/25/20, MD Authorized by: Merwyn Katos, MD     Interpretation: normal     ECG rate:  92   ECG rate assessment: normal     Rhythm: sinus rhythm     Ectopy: none     Conduction: normal       ____________________________________________   INITIAL IMPRESSION / ASSESSMENT AND PLAN / ED COURSE  As part of my medical decision making, I reviewed the following data within the electronic MEDICAL RECORD NUMBER Nursing notes reviewed and incorporated, Labs reviewed, EKG interpreted, Old chart reviewed, Radiograph reviewed and Notes from prior ED visits  reviewed and incorporated        Patient is a 30 year old male who presents for periumbilical abdominal pain over the last 2 days with associated nausea/vomiting.  Differential diagnosis for this patient includes but is not limited to: Appendicitis, cholecystitis, colitis, infectious gastroenteritis, Covid, bowel obstruction  Laboratory evaluation significant for: WBC 17.8  Meds periumbilical pain and leukocytosis, patient will require a CT of the abdomen pelvis with IV contrast for evaluation of any acute intra-abdominal infection.     ____________________________________________   FINAL CLINICAL IMPRESSION(S) / ED DIAGNOSES  Final diagnoses:  Periumbilical abdominal pain  Nausea vomiting and diarrhea     ED Discharge Orders    None       Note:  This document was prepared using Dragon voice recognition software and may include unintentional dictation errors.   26, MD 05/25/20 [phone]

## 2020-05-25 NOTE — ED Notes (Signed)
Pt in CT.

## 2020-05-25 NOTE — ED Provider Notes (Signed)
Asked to review CT scan, no acute abnormality, will start Protonix, Carafate, have patient follow-up closely with GI.   Jene Every, MD 05/25/20 1710

## 2020-05-25 NOTE — ED Triage Notes (Addendum)
Pt states that he got H Pylori from prison- pt states he was released Oct. 2021 and pt was given treatment- pt states still has abdominal discomfort, acid reflux, and has vomited this AM

## 2020-05-25 NOTE — ED Notes (Signed)
Pt provided phone since his cell phone died.

## 2020-07-09 ENCOUNTER — Ambulatory Visit (INDEPENDENT_AMBULATORY_CARE_PROVIDER_SITE_OTHER): Payer: Self-pay | Admitting: Gastroenterology

## 2020-07-09 ENCOUNTER — Encounter: Payer: Self-pay | Admitting: Gastroenterology

## 2020-07-09 ENCOUNTER — Other Ambulatory Visit: Payer: Self-pay

## 2020-07-09 VITALS — BP 162/103 | HR 75 | Temp 97.5°F | Wt 157.0 lb

## 2020-07-09 DIAGNOSIS — R17 Unspecified jaundice: Secondary | ICD-10-CM

## 2020-07-09 DIAGNOSIS — R1013 Epigastric pain: Secondary | ICD-10-CM

## 2020-07-09 NOTE — Patient Instructions (Signed)
Please take Miralax 17 grams daily. This could be purchased at your local pharmacy with no prescriptions.  High-Fiber Eating Plan Fiber, also called dietary fiber, is a type of carbohydrate. It is found foods such as fruits, vegetables, whole grains, and beans. A high-fiber diet can have many health benefits. Your health care provider may recommend a high-fiber diet to help:  Prevent constipation. Fiber can make your bowel movements more regular.  Lower your cholesterol.  Relieve the following conditions: ? Inflammation of veins in the anus (hemorrhoids). ? Inflammation of specific areas of the digestive tract (uncomplicated diverticulosis). ? A problem of the large intestine, also called the colon, that sometimes causes pain and diarrhea (irritable bowel syndrome, or IBS).  Prevent overeating as part of a weight-loss plan.  Prevent heart disease, type 2 diabetes, and certain cancers. What are tips for following this plan? Reading food labels  Check the nutrition facts label on food products for the amount of dietary fiber. Choose foods that have 5 grams of fiber or more per serving.  The goals for recommended daily fiber intake include: ? Men (age 29 or younger): 34-38 g. ? Men (over age 71): 28-34 g. ? Women (age 29 or younger): 25-28 g. ? Women (over age 29): 22-25 g. Your daily fiber goal is _____________ g.   Shopping  Choose whole fruits and vegetables instead of processed forms, such as apple juice or applesauce.  Choose a wide variety of high-fiber foods such as avocados, lentils, oats, and kidney beans.  Read the nutrition facts label of the foods you choose. Be aware of foods with added fiber. These foods often have high sugar and sodium amounts per serving. Cooking  Use whole-grain flour for baking and cooking.  Cook with brown rice instead of white rice. Meal planning  Start the day with a breakfast that is high in fiber, such as a cereal that contains 5 g of  fiber or more per serving.  Eat breads and cereals that are made with whole-grain flour instead of refined flour or white flour.  Eat brown rice, bulgur wheat, or millet instead of white rice.  Use beans in place of meat in soups, salads, and pasta dishes.  Be sure that half of the grains you eat each day are whole grains. General information  You can get the recommended daily intake of dietary fiber by: ? Eating a variety of fruits, vegetables, grains, nuts, and beans. ? Taking a fiber supplement if you are not able to take in enough fiber in your diet. It is better to get fiber through food than from a supplement.  Gradually increase how much fiber you consume. If you increase your intake of dietary fiber too quickly, you may have bloating, cramping, or gas.  Drink plenty of water to help you digest fiber.  Choose high-fiber snacks, such as berries, raw vegetables, nuts, and popcorn. What foods should I eat? Fruits Berries. Pears. Apples. Oranges. Avocado. Prunes and raisins. Dried figs. Vegetables Sweet potatoes. Spinach. Kale. Artichokes. Cabbage. Broccoli. Cauliflower. Green peas. Carrots. Squash. Grains Whole-grain breads. Multigrain cereal. Oats and oatmeal. Brown rice. Barley. Bulgur wheat. Millet. Quinoa. Bran muffins. Popcorn. Rye wafer crackers. Meats and other proteins Navy beans, kidney beans, and pinto beans. Soybeans. Split peas. Lentils. Nuts and seeds. Dairy Fiber-fortified yogurt. Beverages Fiber-fortified soy milk. Fiber-fortified orange juice. Other foods Fiber bars. The items listed above may not be a complete list of recommended foods and beverages. Contact a dietitian for more information. What  foods should I avoid? Fruits Fruit juice. Cooked, strained fruit. Vegetables Fried potatoes. Canned vegetables. Well-cooked vegetables. Grains White bread. Pasta made with refined flour. White rice. Meats and other proteins Fatty cuts of meat. Fried chicken or  fried fish. Dairy Milk. Yogurt. Cream cheese. Sour cream. Fats and oils Butters. Beverages Soft drinks. Other foods Cakes and pastries. The items listed above may not be a complete list of foods and beverages to avoid. Talk with your dietitian about what choices are best for you. Summary  Fiber is a type of carbohydrate. It is found in foods such as fruits, vegetables, whole grains, and beans.  A high-fiber diet has many benefits. It can help to prevent constipation, lower blood cholesterol, aid weight loss, and reduce your risk of heart disease, diabetes, and certain cancers.  Increase your intake of fiber gradually. Increasing fiber too quickly may cause cramping, bloating, and gas. Drink plenty of water while you increase the amount of fiber you consume.  The best sources of fiber include whole fruits and vegetables, whole grains, nuts, seeds, and beans. This information is not intended to replace advice given to you by your health care provider. Make sure you discuss any questions you have with your health care provider. Document Revised: 07/12/2019 Document Reviewed: 07/12/2019 Elsevier Patient Education  2021 [employer].

## 2020-07-09 NOTE — Progress Notes (Signed)
George Barrera [address]  [address]  [place], [place] [address]  Main: [phone]  Fax: [phone]   Gastroenterology Consultation  Referring Provider:     Dr. Cyril Barrera Primary Care Physician:  Patient, No Pcp Per (Inactive) Reason for Consultation:     Abdominal pain        HPI:    CC: Abdominal pain  George Barrera is a 30 y.o. y/o male referred for consultation & management  by Dr. Patient, No Pcp Per (Inactive).  Patient reports previous history of H. pylori when he was previously incarcerated.  States he only received 3 pills for H. pylori treatment at that time.  Describes mid abdominal dull pain, nonradiating.  5/10.  Present with or without meals.  No dysphagia.  No prior upper or lower endoscopy.  No family history of GI malignancy.  No past medical history on file.  No past surgical history on file.  Prior to Admission medications   Medication Sig Start Date End Date Taking? Authorizing Provider  promethazine (PHENERGAN) 12.5 MG tablet Take 1 tablet (12.5 mg total) by mouth every 8 (eight) hours as needed for nausea or vomiting. 12/29/14 09/30/15  George Slack, PA-C    No family history on file.   Social History   Tobacco Use  . Smoking status: Current Every Day Smoker    Packs/day: 0.50    Types: Cigarettes  . Smokeless tobacco: Never Used  Substance Use Topics  . Alcohol use: No  . Drug use: No    Allergies as of 07/09/2020  . (No Known Allergies)    Review of Systems:    All systems reviewed and negative except where noted in HPI.   Physical Exam:  BP (!) 162/103   Pulse 75   Temp (!) 97.5 F (36.4 C) (Oral)   Wt 157 lb (71.2 kg)   BMI 22.53 kg/m  No LMP for male patient. Psych:  Alert and cooperative. Normal mood and affect. General:   Alert,  Well-developed, well-nourished, pleasant and cooperative in NAD Head:  Normocephalic and atraumatic. Eyes:  Sclera clear, no icterus.   Conjunctiva pink. Ears:  Normal auditory  acuity. Nose:  No deformity, discharge, or lesions. Mouth:  No deformity or lesions,oropharynx pink & moist. Neck:  Supple; no masses or thyromegaly. Abdomen:  Normal bowel sounds.  No bruits.  Soft, non-tender and non-distended without masses, hepatosplenomegaly or hernias noted.  No guarding or rebound tenderness.    Msk:  Symmetrical without gross deformities. Good, equal movement & strength bilaterally. Pulses:  Normal pulses noted. Extremities:  No clubbing or edema.  No cyanosis. Neurologic:  Alert and oriented x3;  grossly normal neurologically. Skin:  Intact without significant lesions or rashes. No jaundice. Lymph Nodes:  No significant cervical adenopathy. Psych:  Alert and cooperative. Normal mood and affect.   Labs: CBC    Component Value Date/Time   WBC 17.8 (H) 05/25/2020 1252   RBC 7.11 (H) 05/25/2020 1252   HGB 16.6 05/25/2020 1252   HCT 48.5 05/25/2020 1252   PLT 308 05/25/2020 1252   MCV 68.2 (L) 05/25/2020 1252   MCH 23.3 (L) 05/25/2020 1252   MCHC 34.2 05/25/2020 1252   RDW 17.3 (H) 05/25/2020 1252   CMP     Component Value Date/Time   NA 137 05/25/2020 1252   K 3.9 05/25/2020 1252   CL 105 05/25/2020 1252   CO2 23 05/25/2020 1252   GLUCOSE 109 (H) 05/25/2020 1252  BUN 14 05/25/2020 1252   CREATININE 0.94 05/25/2020 1252   CALCIUM 9.0 05/25/2020 1252   PROT 7.6 05/25/2020 1252   ALBUMIN 4.3 05/25/2020 1252   AST 24 05/25/2020 1252   ALT 14 05/25/2020 1252   ALKPHOS 59 05/25/2020 1252   BILITOT 1.3 (H) 05/25/2020 1252   GFRNONAA >60 05/25/2020 1252   GFRAA >60 12/24/2014 1347    Imaging Studies: No results found.  Assessment and Plan:   George Barrera is a 30 y.o. y/o male has been referred for abdominal pain, with previous history of H. pylori that was only treated with 3 pills as per patient, with previous records not available as patient was incarcerated at the time  It appears that patient may have had a history of H. pylori   Given  return of symptoms recently, I will obtain H. pylori breath test.  Patient is not on any PPI  I also noticed that patient's white blood cell count was mildly elevated during recent ER visits, and total bilirubin was elevated.  Obtain repeat CBC and fractionated bilirubin  Dr George Barrera  Speech recognition software was used to dictate the above note.

## 2020-07-10 ENCOUNTER — Other Ambulatory Visit: Payer: Self-pay | Admitting: *Deleted

## 2020-07-10 DIAGNOSIS — R718 Other abnormality of red blood cells: Secondary | ICD-10-CM

## 2020-07-11 LAB — CBC
Hematocrit: 51.3 % — ABNORMAL HIGH (ref 37.5–51.0)
Hemoglobin: 16.9 g/dL (ref 13.0–17.7)
MCH: 23.6 pg — ABNORMAL LOW (ref 26.6–33.0)
MCHC: 32.9 g/dL (ref 31.5–35.7)
MCV: 72 fL — ABNORMAL LOW (ref 79–97)
Platelets: 304 10*3/uL (ref 150–450)
RBC: 7.16 x10E6/uL (ref 4.14–5.80)
RDW: 18.8 % — ABNORMAL HIGH (ref 11.6–15.4)
WBC: 10.4 10*3/uL (ref 3.4–10.8)

## 2020-07-11 LAB — BILIRUBIN, FRACTIONATED(TOT/DIR/INDIR)
Bilirubin Total: 0.6 mg/dL (ref 0.0–1.2)
Bilirubin, Direct: 0.18 mg/dL (ref 0.00–0.40)
Bilirubin, Indirect: 0.42 mg/dL (ref 0.10–0.80)

## 2020-07-11 LAB — H. PYLORI BREATH TEST: H pylori Breath Test: NEGATIVE

## 2020-07-15 ENCOUNTER — Encounter: Payer: Self-pay | Admitting: Oncology

## 2020-07-15 ENCOUNTER — Inpatient Hospital Stay: Payer: Self-pay | Attending: Oncology | Admitting: Oncology

## 2020-07-15 ENCOUNTER — Inpatient Hospital Stay: Payer: Self-pay

## 2020-07-15 VITALS — BP 133/99 | HR 80 | Resp 20 | Wt 153.0 lb

## 2020-07-15 DIAGNOSIS — R718 Other abnormality of red blood cells: Secondary | ICD-10-CM

## 2020-07-15 DIAGNOSIS — D751 Secondary polycythemia: Secondary | ICD-10-CM

## 2020-07-15 DIAGNOSIS — F17210 Nicotine dependence, cigarettes, uncomplicated: Secondary | ICD-10-CM | POA: Insufficient documentation

## 2020-07-15 LAB — CBC WITH DIFFERENTIAL/PLATELET
Abs Immature Granulocytes: 0.02 10*3/uL (ref 0.00–0.07)
Basophils Absolute: 0 10*3/uL (ref 0.0–0.1)
Basophils Relative: 0 %
Eosinophils Absolute: 0.1 10*3/uL (ref 0.0–0.5)
Eosinophils Relative: 1 %
HCT: 48.9 % (ref 39.0–52.0)
Hemoglobin: 16.8 g/dL (ref 13.0–17.0)
Immature Granulocytes: 0 %
Lymphocytes Relative: 30 %
Lymphs Abs: 2.8 10*3/uL (ref 0.7–4.0)
MCH: 23.9 pg — ABNORMAL LOW (ref 26.0–34.0)
MCHC: 34.4 g/dL (ref 30.0–36.0)
MCV: 69.5 fL — ABNORMAL LOW (ref 80.0–100.0)
Monocytes Absolute: 1 10*3/uL (ref 0.1–1.0)
Monocytes Relative: 10 %
Neutro Abs: 5.4 10*3/uL (ref 1.7–7.7)
Neutrophils Relative %: 59 %
Platelets: 293 10*3/uL (ref 150–400)
RBC: 7.04 MIL/uL — ABNORMAL HIGH (ref 4.22–5.81)
RDW: 16.6 % — ABNORMAL HIGH (ref 11.5–15.5)
Smear Review: NORMAL
WBC: 9.2 10*3/uL (ref 4.0–10.5)
nRBC: 0 % (ref 0.0–0.2)

## 2020-07-15 LAB — TECHNOLOGIST SMEAR REVIEW
Plt Morphology: NORMAL
RBC MORPHOLOGY: NORMAL
WBC MORPHOLOGY: NORMAL

## 2020-07-15 LAB — IRON AND TIBC
Iron: 144 ug/dL (ref 45–182)
Saturation Ratios: 33 % (ref 17.9–39.5)
TIBC: 434 ug/dL (ref 250–450)
UIBC: 290 ug/dL

## 2020-07-15 LAB — FERRITIN: Ferritin: 27 ng/mL (ref 24–336)

## 2020-07-16 ENCOUNTER — Encounter: Payer: Self-pay | Admitting: Oncology

## 2020-07-16 NOTE — Progress Notes (Signed)
Hematology/Oncology Consult note [facility] [phone] [phone]  Patient Care Team: Patient, No Pcp Per (Inactive) as PCP - General (General Practice)   Name of the patient: George Barrera  [id number]  [birth year]    Reason for referral-microcytosis   Referring physician-Dr. Maximino Greenland  Date of visit: 07/16/20   History of presenting illness-patient is a 30 year old African-American male with No significant past medical history.  He was seen by GI recently for his history of H. pylori infection at that time he had not C checked which showed a white cell count of 10.4, H&H of 16.9/51.3 with an MCV of 72 and a platelet count of 304.  Total RBC count was increased at 7.16.  Patient referred to [place] for microcytosis.  He reports some ongoing fatigue but denies other complaints at this time.  Patient denies any prior diagnosis of thalassemia.  Denies any family history of bleeding disorders.  ECOG PS- 1  Pain scale- 0   Review of systems- Review of Systems  Constitutional: Positive for malaise/fatigue. Negative for chills, fever and weight loss.  HENT: Negative for congestion, ear discharge and nosebleeds.   Eyes: Negative for blurred vision.  Respiratory: Negative for cough, hemoptysis, sputum production, shortness of breath and wheezing.   Cardiovascular: Negative for chest pain, palpitations, orthopnea and claudication.  Gastrointestinal: Negative for abdominal pain, blood in stool, constipation, diarrhea, heartburn, melena, nausea and vomiting.  Genitourinary: Negative for dysuria, flank pain, frequency, hematuria and urgency.  Musculoskeletal: Negative for back pain, joint pain and myalgias.  Skin: Negative for rash.  Neurological: Negative for dizziness, tingling, focal weakness, seizures, weakness and headaches.  Endo/Heme/Allergies: Does not bruise/bleed easily.  Psychiatric/Behavioral: Negative for depression and suicidal ideas.  The patient does not have insomnia.     No Known Allergies  There are no problems to display for this patient.    History reviewed. No pertinent past medical history.   History reviewed. No pertinent surgical history.  Social History   Socioeconomic History  . Marital status: Single    Spouse name: Not on file  . Number of children: Not on file  . Years of education: Not on file  . Highest education level: Not on file  Occupational History  . Not on file  Tobacco Use  . Smoking status: Current Every Day Smoker    Packs/day: 0.50    Types: Cigarettes  . Smokeless tobacco: Never Used  Substance and Sexual Activity  . Alcohol use: No  . Drug use: No  . Sexual activity: Not on file  Other Topics Concern  . Not on file  Social History Narrative  . Not on file   Social Determinants of Health   Financial Resource Strain: Not on file  Food Insecurity: Not on file  Transportation Needs: Not on file  Physical Activity: Not on file  Stress: Not on file  Social Connections: Not on file  Intimate Partner Violence: Not on file     History reviewed. No pertinent family history.  No current outpatient medications on file.   Physical exam:  Vitals:   07/15/20 1524  BP: (!) 133/99  Pulse: 80  Resp: 20  SpO2: 100%  Weight: 153 lb (69.4 kg)   Physical Exam Constitutional:      General: He is not in acute distress. Cardiovascular:     Rate and Rhythm: Normal rate and regular rhythm.     Heart sounds: Normal heart sounds.  Pulmonary:     Effort: Pulmonary  effort is normal.     Breath sounds: Normal breath sounds.  Abdominal:     General: Bowel sounds are normal.     Palpations: Abdomen is soft.     Comments: No palpable splenomegaly  Lymphadenopathy:     Comments: No palpable cervical, supraclavicular, axillary or inguinal adenopathy   Skin:    General: Skin is warm and dry.  Neurological:     Mental Status: He is alert and oriented to person, place, and  time.        CMP Latest Ref Rng & Units 07/09/2020  Glucose 70 - 99 mg/dL -  BUN 6 - 20 mg/dL -  Creatinine 9.70 - 2.63 mg/dL -  Sodium 785 - 885 mmol/L -  Potassium 3.5 - 5.1 mmol/L -  Chloride 98 - 111 mmol/L -  CO2 22 - 32 mmol/L -  Calcium 8.9 - 10.3 mg/dL -  Total Protein 6.5 - 8.1 g/dL -  Total Bilirubin 0.0 - 1.2 mg/dL 0.6  Alkaline Phos 38 - 126 U/L -  AST 15 - 41 U/L -  ALT 0 - 44 U/L -   CBC Latest Ref Rng & Units 07/15/2020  WBC 4.0 - 10.5 K/uL 9.2  Hemoglobin 13.0 - 17.0 g/dL 02.7  Hematocrit 74.1 - 52.0 % 48.9  Platelets 150 - 400 K/uL 293     Assessment and plan- Patient is a 30 y.o. male referred for microcytosis  Microcytosis without anemia) borderline polycythemia with increased RBC count is possibly suggestive of thalassemia.  We will check CBC, smear review, hemoglobin electrophoresis ferritin and iron studies today.  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  Check JAK2 and exon 12 today.  Video visit in 2 weeks to discuss results of blood work  Thank you for this kind referral and the opportunity to participate in the care of this patient   Visit Diagnosis 1. Microcytosis   2. Polycythemia, secondary     Dr. Owens Shark, MD, MPH [facility] [phone] 07/16/2020  9:14 AM

## 2020-07-18 LAB — HGB FRACTIONATION BY HPLC
Hgb A2: 3.3 % — ABNORMAL HIGH (ref 1.8–3.2)
Hgb A: 69.7 % — ABNORMAL LOW (ref 96.4–98.8)
Hgb C: 27 % — ABNORMAL HIGH
Hgb E: 0 %
Hgb F: 0 % (ref 0.0–2.0)
Hgb S: 0 %
Hgb Variant: 0 %

## 2020-07-18 LAB — HGB FRACTIONATION CASCADE

## 2020-07-21 LAB — JAK2 EXONS 12-15

## 2020-07-29 ENCOUNTER — Telehealth: Payer: Self-pay | Admitting: *Deleted

## 2020-07-29 ENCOUNTER — Inpatient Hospital Stay: Payer: Self-pay | Admitting: Oncology

## 2020-07-29 ENCOUNTER — Encounter: Payer: Self-pay | Admitting: Oncology

## 2020-07-29 NOTE — Telephone Encounter (Signed)
RN called to review information and medications for mychart video visit scheduled for 245pm today.  Left message to call RN call back.

## 2020-07-29 NOTE — Progress Notes (Unsigned)
Pt returned call for mychart video visit and states he is currently as [facility] for a left ankle injury.  Pt states he fell and hurt ankle and is waiting for xrays and for doctor.

## 2020-08-06 ENCOUNTER — Inpatient Hospital Stay: Payer: Self-pay | Attending: Oncology | Admitting: Oncology

## 2020-10-09 ENCOUNTER — Ambulatory Visit: Payer: Self-pay | Admitting: Gastroenterology

## 2020-10-15 ENCOUNTER — Ambulatory Visit: Payer: Self-pay | Admitting: Gastroenterology

## 2020-11-21 ENCOUNTER — Emergency Department: Payer: Self-pay

## 2020-11-21 ENCOUNTER — Emergency Department
Admission: EM | Admit: 2020-11-21 | Discharge: 2020-11-21 | Disposition: A | Discharge status: Home / Self Care | Payer: Self-pay | Attending: Emergency Medicine | Admitting: Emergency Medicine

## 2020-11-21 ENCOUNTER — Other Ambulatory Visit: Payer: Self-pay

## 2020-11-21 DIAGNOSIS — B9789 Other viral agents as the cause of diseases classified elsewhere: Secondary | ICD-10-CM | POA: Insufficient documentation

## 2020-11-21 DIAGNOSIS — F17210 Nicotine dependence, cigarettes, uncomplicated: Secondary | ICD-10-CM | POA: Insufficient documentation

## 2020-11-21 DIAGNOSIS — J069 Acute upper respiratory infection, unspecified: Secondary | ICD-10-CM | POA: Insufficient documentation

## 2020-11-21 MED ORDER — PSEUDOEPH-BROMPHEN-DM 30-2-10 MG/5ML PO SYRP: 10.0000 mL | ORAL_SOLUTION | Freq: Four times a day (QID) | ORAL | 0 refills | Status: DC | PRN

## 2020-11-21 NOTE — ED Provider Notes (Signed)
[facility] Emergency Department Provider Note ____________________________________________   Event Date/Time   First MD Initiated Contact with Patient 11/21/20 1406     (approximate)  I have reviewed the triage vital signs and the nursing notes.   HISTORY  Chief Complaint Cough  HPI George Barrera is a 30 y.o. male with no chronic medical history presents to the emergency department for treatment and evaluation of cough for the past couple weeks.  No fever, chills, body aches.  He has taken 2 home COVID test which were both negative.      No past medical history on file.  There are no problems to display for this patient.   No past surgical history on file.  Prior to Admission medications   Medication Sig Start Date End Date Taking? Authorizing Provider  brompheniramine-pseudoephedrine-DM 30-2-10 MG/5ML syrup Take 10 mLs by mouth 4 (four) times daily as needed. 11/21/20  Yes Amori Cooperman B, FNP  promethazine (PHENERGAN) 12.5 MG tablet Take 1 tablet (12.5 mg total) by mouth every 8 (eight) hours as needed for nausea or vomiting. 12/29/14 09/30/15  Evon Slack, PA-C    Allergies Patient has no known allergies.  No family history on file.  Social History Social History   Tobacco Use   Smoking status: Every Day    Packs/day: 2.00    Types: Cigarettes   Smokeless tobacco: Never  Substance Use Topics   Alcohol use: No   Drug use: No    Review of Systems  Constitutional: No fever/chills Eyes: No visual changes. ENT: No sore throat. Cardiovascular: Denies chest pain. Respiratory: Denies shortness of breath.  Positive for cough Gastrointestinal: No abdominal pain.  No nausea, no vomiting.  No diarrhea.  No constipation. Genitourinary: Negative for dysuria. Musculoskeletal: Negative for back pain. Skin: Negative for rash. Neurological: Negative for headaches, focal weakness or  numbness.  ____________________________________________   PHYSICAL EXAM:  VITAL SIGNS: ED Triage Vitals  Enc Vitals Group     BP 11/21/20 1318 126/77     Pulse Rate 11/21/20 1318 (!) 105     Resp 11/21/20 1318 18     Temp 11/21/20 1318 98.4 F (36.9 C)     Temp Source 11/21/20 1318 Oral     SpO2 11/21/20 1318 96 %     Weight 11/21/20 1318 160 lb (72.6 kg)     Height 11/21/20 1318 5\' 9"  (1.753 m)     Head Circumference --      Peak Flow --      Pain Score 11/21/20 1321 0     Pain Loc --      Pain Edu? --      Excl. in GC? --     Constitutional: Alert and oriented. Well appearing and in no acute distress. Eyes: Conjunctivae are normal. PERRL. EOMI. Head: Atraumatic. Nose: No congestion/rhinnorhea. Mouth/Throat: Mucous membranes are moist.  Oropharynx non-erythematous. Neck: No stridor.   Hematological/Lymphatic/Immunilogical: No cervical lymphadenopathy. Cardiovascular: Normal rate, regular rhythm. Grossly normal heart sounds.  Good peripheral circulation. Respiratory: Normal respiratory effort.  No retractions. Lungs CTAB. Gastrointestinal: Soft and nontender. No distention. No abdominal bruits. No CVA tenderness. Genitourinary:  Musculoskeletal: No lower extremity tenderness nor edema.  No joint effusions. Neurologic:  Normal speech and language. No gross focal neurologic deficits are appreciated. No gait instability. Skin:  Skin is warm, dry and intact. No rash noted. Psychiatric: Mood and affect are normal. Speech and behavior are normal.  ____________________________________________   LABS (all labs  ordered are listed, but only abnormal results are displayed)  Labs Reviewed - No data to display ____________________________________________  EKG  Not indicated ____________________________________________  RADIOLOGY  ED MD interpretation:    Chest x-ray negative for acute cardiopulmonary abnormality.  I, Kem Boroughs, personally viewed and evaluated  these images (plain radiographs) as part of my medical decision making, as well as reviewing the written report by the radiologist.  Official radiology report(s): DG Chest 2 View  Result Date: 11/21/2020 CLINICAL DATA:  Cough EXAM: CHEST - 2 VIEW COMPARISON:  None. FINDINGS: Normal mediastinum and cardiac silhouette. Normal pulmonary vasculature. No evidence of effusion, infiltrate, or pneumothorax. No acute bony abnormality. IMPRESSION: Normal chest radiograph Electronically Signed   By: Genevive Bi M.D.   On: 11/21/2020 14:48    ____________________________________________   PROCEDURES  Procedure(s) performed (including Critical Care):  Procedures  ____________________________________________   INITIAL IMPRESSION / ASSESSMENT AND PLAN     30 year old male presenting to the emergency department for treatment and evaluation of persistent cough.  See HPI for further details.  DIFFERENTIAL DIAGNOSIS  COVID-19, viral syndrome, pneumonia  ED COURSE  Chest x-ray is negative for acute findings.  Patient states that he does smoke cigarettes which is likely the reason that his cough has persisted.  He will be treated with Bromfed and advised to follow-up with primary care if her symptoms are not improving over the week.  He was advised to return to the emergency department for symptoms of change or worsen if he is unable to schedule an appointment.    ___________________________________________   FINAL CLINICAL IMPRESSION(S) / ED DIAGNOSES  Final diagnoses:  Viral URI with cough     ED Discharge Orders          Ordered    brompheniramine-pseudoephedrine-DM 30-2-10 MG/5ML syrup  4 times daily PRN        11/21/20 1514             ZYLER HYSON was evaluated in Emergency Department on 11/21/2020 for the symptoms described in the history of present illness. He was evaluated in the context of the global COVID-19 pandemic, which necessitated consideration that the  patient might be at risk for infection with the SARS-CoV-2 virus that causes COVID-19. Institutional protocols and algorithms that pertain to the evaluation of patients at risk for COVID-19 are in a state of rapid change based on information released by regulatory bodies including the CDC and federal and state organizations. These policies and algorithms were followed during the patient's care in the ED.   Note:  This document was prepared using Dragon voice recognition software and may include unintentional dictation errors.    Chinita Pester, FNP 11/21/20 1535    Jene Every, MD 11/21/20 1553

## 2020-11-21 NOTE — Discharge Instructions (Addendum)
Chest x-ray is normal.  Take the cough medication as prescribed.  Follow up with primary care or return to the ER for additional concern.

## 2020-11-21 NOTE — ED Triage Notes (Signed)
Pt arrives via pov w/ c/o cough x couple weeks. Pt reports having a cold, but cough will not go away. Pt also reports he is a smoker and has increased the amount he has been smoking recently. NAD noted at this time

## 2020-12-11 ENCOUNTER — Other Ambulatory Visit: Payer: Self-pay

## 2020-12-11 ENCOUNTER — Emergency Department
Admission: EM | Admit: 2020-12-11 | Discharge: 2020-12-11 | Disposition: A | Discharge status: Home / Self Care | Payer: Self-pay | Attending: Emergency Medicine | Admitting: Emergency Medicine

## 2020-12-11 DIAGNOSIS — F17210 Nicotine dependence, cigarettes, uncomplicated: Secondary | ICD-10-CM | POA: Insufficient documentation

## 2020-12-11 DIAGNOSIS — R42 Dizziness and giddiness: Secondary | ICD-10-CM | POA: Insufficient documentation

## 2020-12-11 DIAGNOSIS — R519 Headache, unspecified: Secondary | ICD-10-CM | POA: Insufficient documentation

## 2020-12-11 LAB — CBC
HCT: 47.5 % (ref 39.0–52.0)
Hemoglobin: 17 g/dL (ref 13.0–17.0)
MCH: 25.1 pg — ABNORMAL LOW (ref 26.0–34.0)
MCHC: 35.8 g/dL (ref 30.0–36.0)
MCV: 70.2 fL — ABNORMAL LOW (ref 80.0–100.0)
Platelets: 287 10*3/uL (ref 150–400)
RBC: 6.77 MIL/uL — ABNORMAL HIGH (ref 4.22–5.81)
RDW: 14.4 % (ref 11.5–15.5)
WBC: 11.3 10*3/uL — ABNORMAL HIGH (ref 4.0–10.5)
nRBC: 0 % (ref 0.0–0.2)

## 2020-12-11 LAB — BASIC METABOLIC PANEL
Anion gap: 7 (ref 5–15)
BUN: 13 mg/dL (ref 6–20)
CO2: 28 mmol/L (ref 22–32)
Calcium: 9.4 mg/dL (ref 8.9–10.3)
Chloride: 102 mmol/L (ref 98–111)
Creatinine, Ser: 1.01 mg/dL (ref 0.61–1.24)
GFR, Estimated: >60 mL/min (ref >60)
Glucose, Bld: 90 mg/dL (ref 70–99)
Potassium: 4.1 mmol/L (ref 3.5–5.1)
Sodium: 137 mmol/L (ref 135–145)

## 2020-12-11 NOTE — ED Triage Notes (Signed)
Pt to ER via Pov with complaints of several weeks of weakness/ dizziness/ and a constant migraine. Reports pain is worst in the morning when waking up. Has been taking excedrin without relief in symptoms.

## 2020-12-11 NOTE — ED Provider Notes (Signed)
[facility]-EMERGENCY DEPARTMENT  ____________________________________________  Time seen: Approximately 9:13 PM  I have reviewed the triage vital signs and the nursing notes.   HISTORY  Chief Complaint Weakness   Historian Patient     HPI George Barrera is a 30 y.o. male presents to the emergency department after experiencing headache and dizziness for several weeks.  Patient states that he had to miss work today due to his headache would like a work note.  Patient has declined pain medications in the emergency department for headache stating that he would just like his work note.  He has been afebrile at home.  No numbness or tingling in the upper and lower extremities.  No chest pain, chest tightness or abdominal pain.   History reviewed. No pertinent past medical history.   Immunizations up to date:  Yes.     History reviewed. No pertinent past medical history.  There are no problems to display for this patient.   History reviewed. No pertinent surgical history.  Prior to Admission medications   Medication Sig Start Date End Date Taking? Authorizing Provider  brompheniramine-pseudoephedrine-DM 30-2-10 MG/5ML syrup Take 10 mLs by mouth 4 (four) times daily as needed. 11/21/20   Triplett, Kasandra Knudsen, FNP  promethazine (PHENERGAN) 12.5 MG tablet Take 1 tablet (12.5 mg total) by mouth every 8 (eight) hours as needed for nausea or vomiting. 12/29/14 09/30/15  Evon Slack, PA-C    Allergies Patient has no known allergies.  No family history on file.  Social History Social History   Tobacco Use   Smoking status: Every Day    Packs/day: 2.00    Types: Cigarettes   Smokeless tobacco: Never  Substance Use Topics   Alcohol use: No   Drug use: No     Review of Systems  Constitutional: No fever/chills Eyes:  No discharge ENT: No upper respiratory complaints. Respiratory: no cough. No SOB/ use of accessory muscles to breath Gastrointestinal:   No nausea, no vomiting.   No diarrhea.  No constipation. Musculoskeletal: Negative for musculoskeletal pain. Neuro: Patient has headache.  Skin: Negative for rash, abrasions, lacerations, ecchymosis.    ____________________________________________   PHYSICAL EXAM:  VITAL SIGNS: ED Triage Vitals  Enc Vitals Group     BP 12/11/20 1447 (!) 133/112     Pulse Rate 12/11/20 1447 74     Resp 12/11/20 1447 18     Temp 12/11/20 1447 98.2 F (36.8 C)     Temp src --      SpO2 12/11/20 1447 99 %     Weight 12/11/20 1445 155 lb (70.3 kg)     Height 12/11/20 1445 5\' 9"  (1.753 m)     Head Circumference --      Peak Flow --      Pain Score 12/11/20 1445 8     Pain Loc --      Pain Edu? --      Excl. in GC? --      Constitutional: Alert and oriented. Well appearing and in no acute distress. Eyes: Conjunctivae are normal. PERRL. EOMI. Head: Atraumatic. ENT:      Nose: No congestion/rhinnorhea.      Mouth/Throat: Mucous membranes are moist.  Neck: No stridor.  No cervical spine tenderness to palpation. Cardiovascular: Normal rate, regular rhythm. Normal S1 and S2.  Good peripheral circulation. Respiratory: Normal respiratory effort without tachypnea or retractions. Lungs CTAB. Good air entry to the bases with no decreased or absent breath sounds Gastrointestinal: Bowel sounds x  4 quadrants. Soft and nontender to palpation. No guarding or rigidity. No distention. Musculoskeletal: Full range of motion to all extremities. No obvious deformities noted Neurologic:  Normal for age. No gross focal neurologic deficits are appreciated.  Skin:  Skin is warm, dry and intact. No rash noted. Psychiatric: Mood and affect are normal for age. Speech and behavior are normal.   ____________________________________________   LABS (all labs ordered are listed, but only abnormal results are displayed)  Labs Reviewed  CBC - Abnormal; Notable for the following components:      Result Value   WBC 11.3 (*)    RBC 6.77 (*)     MCV 70.2 (*)    MCH 25.1 (*)    All other components within normal limits  BASIC METABOLIC PANEL  CBG MONITORING, ED   ____________________________________________  EKG   ____________________________________________  RADIOLOGY   No results found.  ____________________________________________    PROCEDURES  Procedure(s) performed:     Procedures     Medications - No data to display   ____________________________________________   INITIAL IMPRESSION / ASSESSMENT AND PLAN / ED COURSE  Pertinent labs & imaging results that were available during my care of the patient were reviewed by me and considered in my medical decision making (see chart for details).      Assessment and plan Headache 30 year old male presents to the emergency department after experiencing a headache off and on for several weeks  Basic labs are obtained in triage showed a reassuring CBC and BMP.  Offered imaging and pain medications for headache but patient declined stating that he just wanted a work note.  Work note and school note were provided at his request.  All patient questions were answered.   ____________________________________________  FINAL CLINICAL IMPRESSION(S) / ED DIAGNOSES  Final diagnoses:  Acute nonintractable headache, unspecified headache type      NEW MEDICATIONS STARTED DURING THIS VISIT:  ED Discharge Orders     None           This chart was dictated using voice recognition software/Dragon. Despite best efforts to proofread, errors can occur which can change the meaning. Any change was purely unintentional.     George Barrera 12/11/20 2116    Delton Prairie, MD 12/11/20 [phone]

## 2021-01-18 ENCOUNTER — Telehealth: Payer: Self-pay | Admitting: Emergency Medicine

## 2021-01-18 NOTE — Progress Notes (Signed)
The patient no-showed for appointment despite this provider sending direct link, reaching out via phone with no response and waiting for at least 10 minutes from appointment time for patient to join. They will be marked as a NS for this appointment/time.   Ginamarie Banfield, PA-C    

## 2021-07-17 IMAGING — CT CT ABD-PELV W/ CM
2 of 4 series · 16 of 46 positions shown, 18 images · IV contrast (APPLIED)
Comparison: December 24, 2014

CLINICAL DATA: Abdominal pain.

EXAM:
CT ABDOMEN AND PELVIS WITH CONTRAST
TECHNIQUE: Multidetector CT imaging of the abdomen and pelvis was performed
using the standard protocol following bolus administration of
intravenous contrast.
CONTRAST:  100mL OMNIPAQUE IOHEXOL 300 MG/ML  SOLN

[Series 2: routine abd/pel with · axial · 0.65mm/px · z∈[-1072,-642]mm · 13 of 94 slices shown, 15 images]
[im 4/94  soft-tissue]
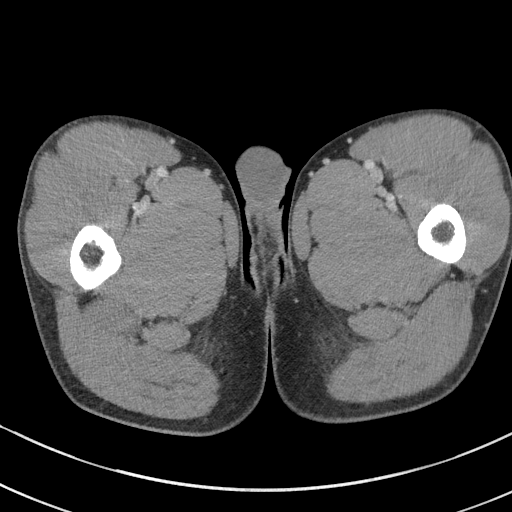
[im 4/94  bone]
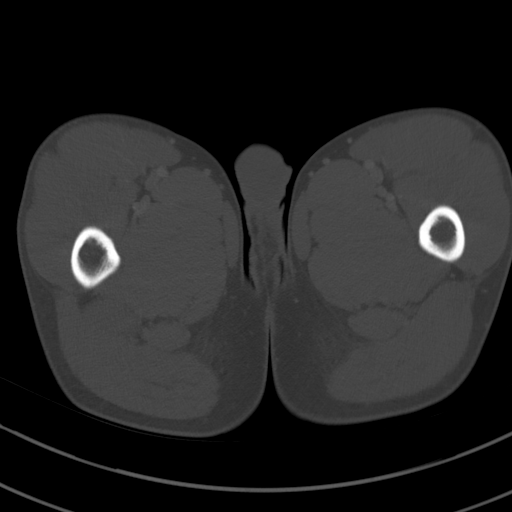
[im 12/94  soft-tissue]
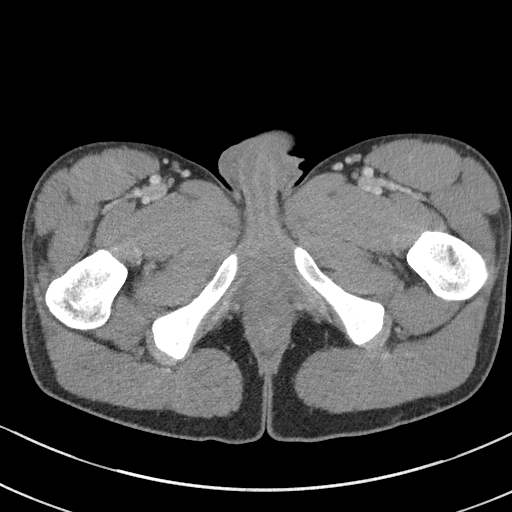
[im 19/94  soft-tissue]
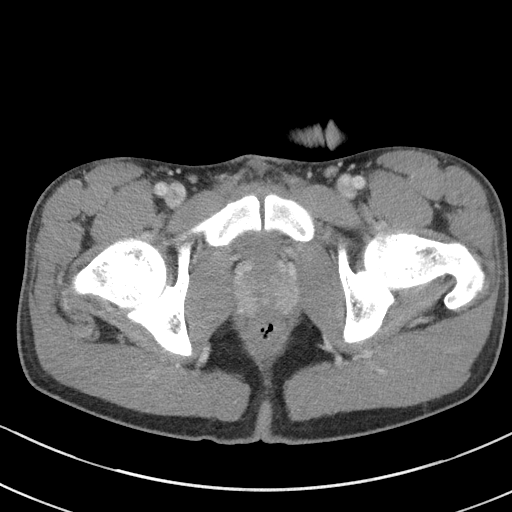
[im 27/94  soft-tissue]
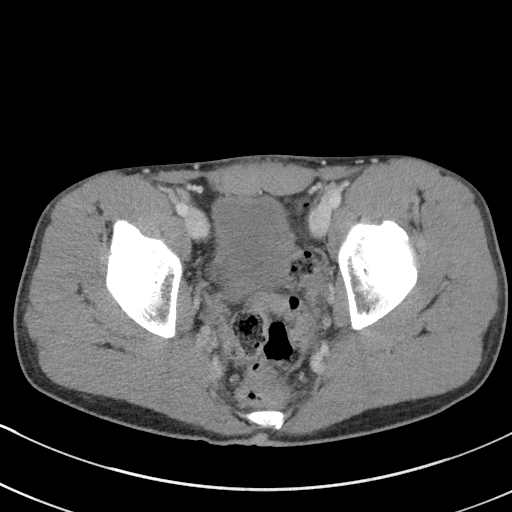
[im 34/94  soft-tissue]
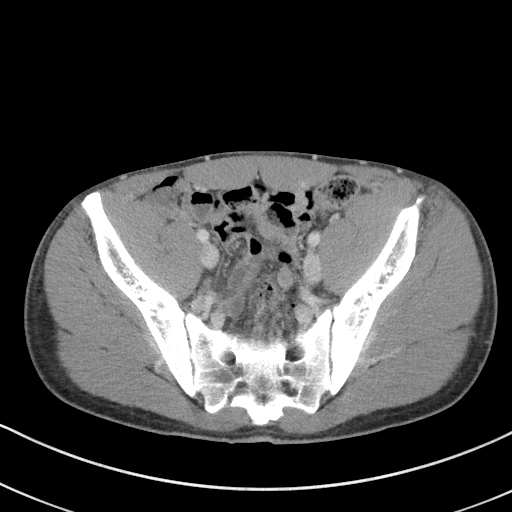
[im 41/94  soft-tissue]
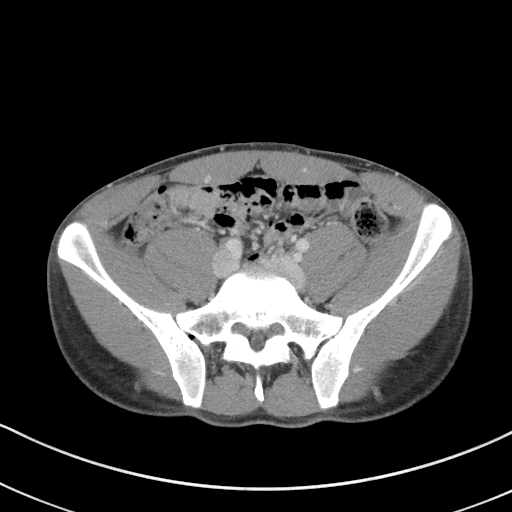
[im 49/94  soft-tissue]
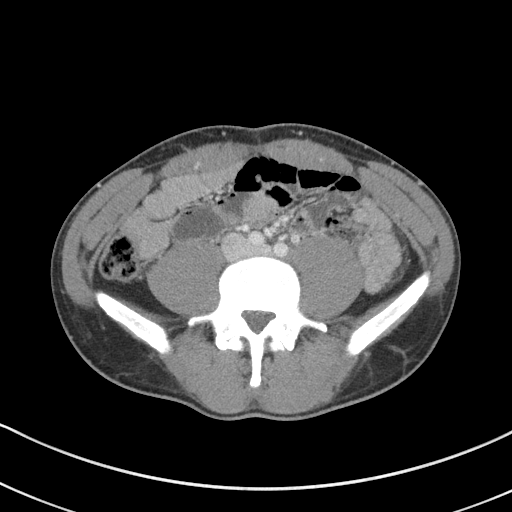
[im 53/94  soft-tissue]
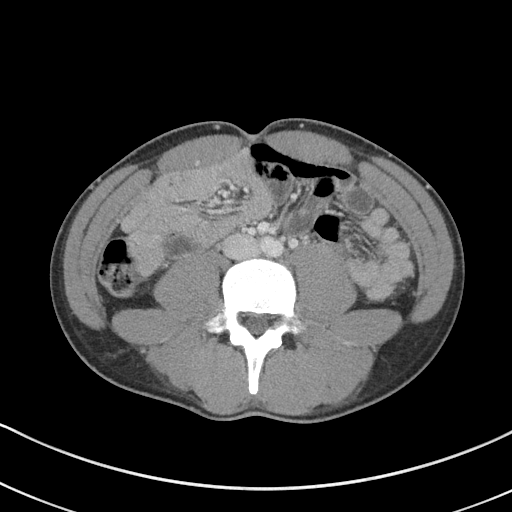
[im 60/94  soft-tissue]
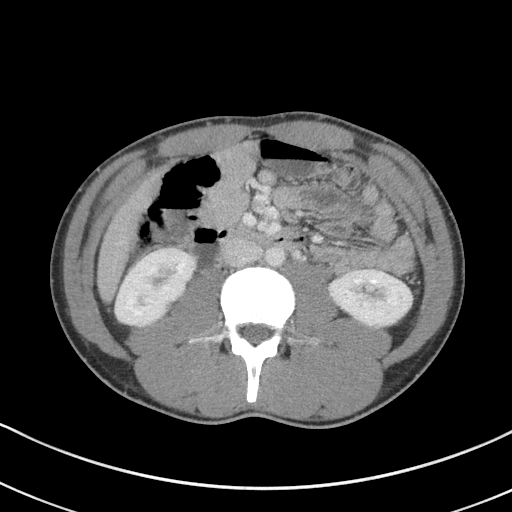
[im 60/94  bone]
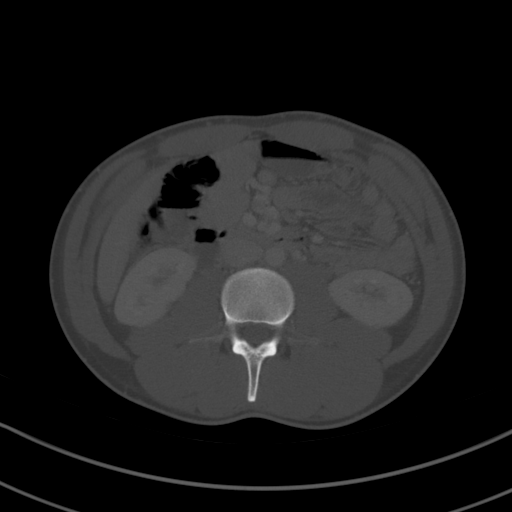
[im 67/94  soft-tissue]
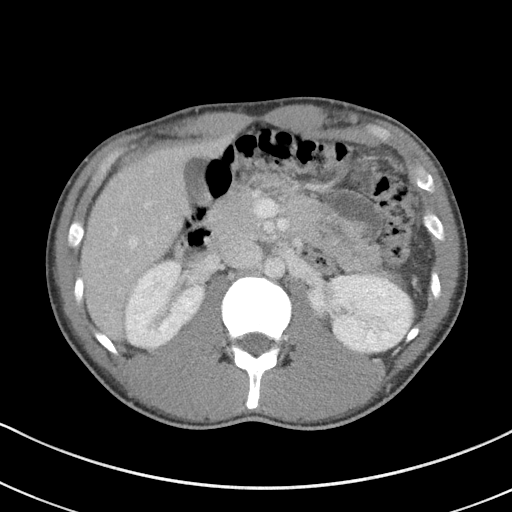
[im 75/94  soft-tissue]
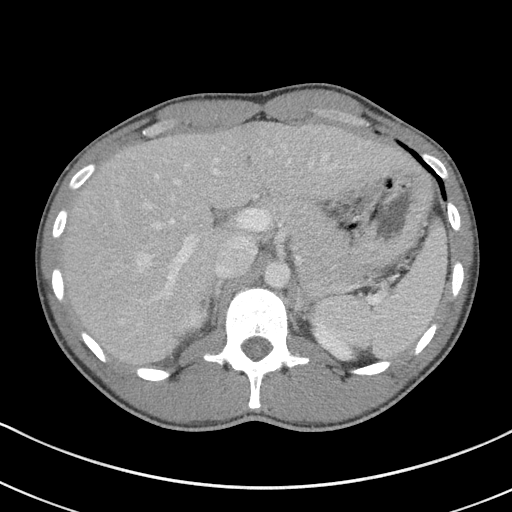
[im 82/94  soft-tissue]
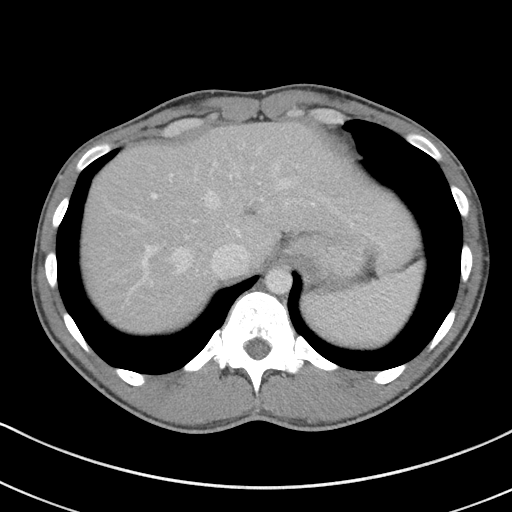
[im 90/94  soft-tissue]
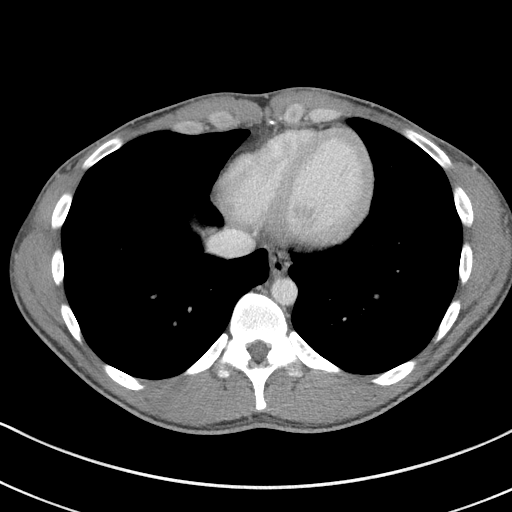

[Series 5: coronal st · coronal · 0.67mm/px · 3 of 72 slices shown]
[im 24/72  soft-tissue]
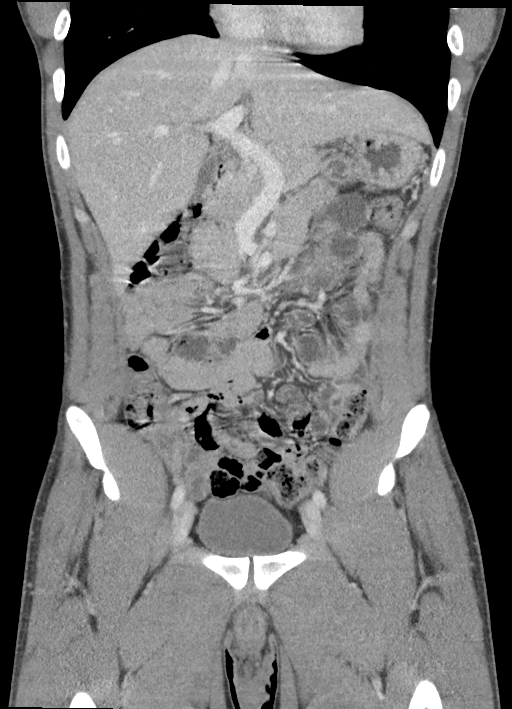
[im 32/72  soft-tissue]
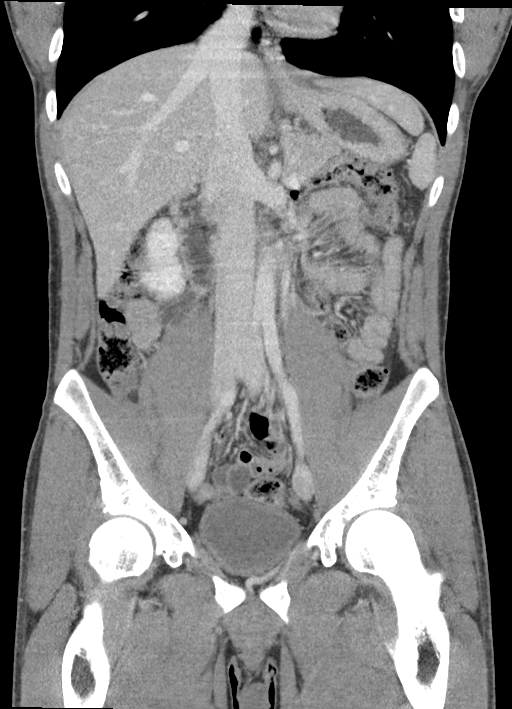
[im 40/72  soft-tissue]
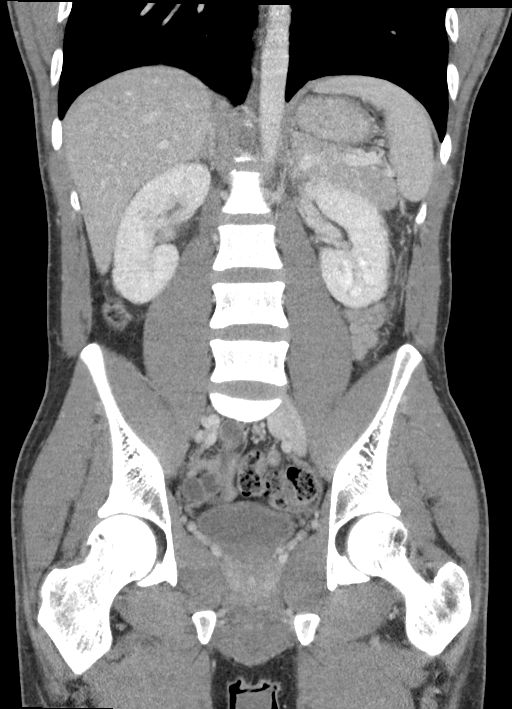

[16 of 46 positions shown; findings below may reference images not displayed]

FINDINGS: Lower chest: No acute abnormality.

Hepatobiliary: No focal liver abnormality is seen. No gallstones,
gallbladder wall thickening, or biliary dilatation.

Pancreas: Unremarkable. No pancreatic ductal dilatation or
surrounding inflammatory changes.

Spleen: Normal in size without focal abnormality.

Adrenals/Urinary Tract: Adrenal glands are unremarkable. Kidneys are
normal, without renal calculi or hydronephrosis. A 5 mm cystic
appearing area of low attenuation seen within the posterolateral
aspect of the mid left kidney. Bladder is unremarkable.

Stomach/Bowel: Stomach is within normal limits. Appendix appears
normal. No evidence of bowel wall thickening, distention, or
inflammatory changes.

Vascular/Lymphatic: No significant vascular findings are present. No
enlarged abdominal or pelvic lymph nodes.

Reproductive: Prostate is unremarkable.

Other: No abdominal wall hernia or abnormality. No abdominopelvic
ascites.

Musculoskeletal: No acute or significant osseous findings.
IMPRESSION: 1. No acute or active process within the abdomen or pelvis.

## 2022-10-19 DIAGNOSIS — Z113 Encounter for screening for infections with a predominantly sexual mode of transmission: Secondary | ICD-10-CM | POA: Diagnosis not present

## 2022-12-11 DIAGNOSIS — F17210 Nicotine dependence, cigarettes, uncomplicated: Secondary | ICD-10-CM | POA: Diagnosis not present

## 2022-12-11 DIAGNOSIS — M79641 Pain in right hand: Secondary | ICD-10-CM | POA: Diagnosis not present

## 2022-12-11 DIAGNOSIS — I10 Essential (primary) hypertension: Secondary | ICD-10-CM | POA: Diagnosis not present

## 2022-12-11 DIAGNOSIS — M7989 Other specified soft tissue disorders: Secondary | ICD-10-CM | POA: Diagnosis not present

## 2022-12-11 DIAGNOSIS — S6991XA Unspecified injury of right wrist, hand and finger(s), initial encounter: Secondary | ICD-10-CM | POA: Diagnosis not present

## 2022-12-11 DIAGNOSIS — X501XXA Overexertion from prolonged static or awkward postures, initial encounter: Secondary | ICD-10-CM | POA: Diagnosis not present

## 2022-12-17 DIAGNOSIS — M79641 Pain in right hand: Secondary | ICD-10-CM | POA: Diagnosis not present

## 2022-12-17 DIAGNOSIS — S63054A Dislocation of other carpometacarpal joint of right hand, initial encounter: Secondary | ICD-10-CM | POA: Diagnosis not present

## 2023-02-12 ENCOUNTER — Emergency Department
Admission: EM | Admit: 2023-02-12 | Discharge: 2023-02-12 | Disposition: A | Discharge status: Home / Self Care | Payer: 59 | Attending: Emergency Medicine | Admitting: Emergency Medicine

## 2023-02-12 ENCOUNTER — Emergency Department: Payer: 59

## 2023-02-12 DIAGNOSIS — Z743 Need for continuous supervision: Secondary | ICD-10-CM | POA: Diagnosis not present

## 2023-02-12 DIAGNOSIS — J439 Emphysema, unspecified: Secondary | ICD-10-CM | POA: Diagnosis not present

## 2023-02-12 DIAGNOSIS — Y92410 Unspecified street and highway as the place of occurrence of the external cause: Secondary | ICD-10-CM | POA: Diagnosis not present

## 2023-02-12 DIAGNOSIS — R531 Weakness: Secondary | ICD-10-CM | POA: Diagnosis not present

## 2023-02-12 DIAGNOSIS — R202 Paresthesia of skin: Secondary | ICD-10-CM | POA: Diagnosis not present

## 2023-02-12 MED ORDER — LIDOCAINE 5 % EX PTCH
1.0000 | MEDICATED_PATCH | CUTANEOUS | Status: DC
  Administered 2023-02-12: 1 via TRANSDERMAL
  Filled 2023-02-12: qty 1

## 2023-02-12 MED ORDER — ACETAMINOPHEN 500 MG PO TABS
1000.0000 mg | ORAL_TABLET | Freq: Once | ORAL | Status: AC
  Administered 2023-02-12: 1000 mg via ORAL
  Filled 2023-02-12: qty 2

## 2023-02-12 MED ORDER — OXYCODONE HCL 5 MG PO TABS
5.0000 mg | ORAL_TABLET | Freq: Once | ORAL | Status: AC
  Administered 2023-02-12: 5 mg via ORAL
  Filled 2023-02-12: qty 1
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
## 2023-02-12 NOTE — ED Notes (Signed)
RN Web designer and provided education on use.  RN educated pt on importance of follow up with neuro per MD recommendation.  Pt denies any numbness or tingling at this time.

## 2023-02-12 NOTE — Discharge Instructions (Addendum)
Your CT scan was were negative for any fractures.  There can be rare issues with there is an issue with a ligament although this seems less likely given your tingling has resolved and your CTs were reassuring however out of precaution we will place you in a c-collar you should call the neurosurgeon number on Monday to make a follow-up appointment for repeat evaluation and consideration of MRIs if felt to be necessary if you continue to have any symptoms.  Return to the ER for weakness on one side, return of tingling on one side, or any other concerns

## 2023-03-04 ENCOUNTER — Other Ambulatory Visit: Payer: Self-pay

## 2023-03-04 DIAGNOSIS — R202 Paresthesia of skin: Secondary | ICD-10-CM
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
## 2023-03-07 ENCOUNTER — Ambulatory Visit
Admission: RE | Admit: 2023-03-07 | Discharge: 2023-03-07 | Disposition: A | Discharge status: Home / Self Care | Payer: 59 | Source: Ambulatory Visit | Attending: Physician Assistant | Admitting: Physician Assistant

## 2023-03-07 ENCOUNTER — Ambulatory Visit (INDEPENDENT_AMBULATORY_CARE_PROVIDER_SITE_OTHER): Payer: 59 | Admitting: Physician Assistant

## 2023-03-07 DIAGNOSIS — R200 Anesthesia of skin: Secondary | ICD-10-CM | POA: Diagnosis not present

## 2023-03-07 DIAGNOSIS — Z758 Other problems related to medical facilities and other health care: Secondary | ICD-10-CM

## 2023-03-07 DIAGNOSIS — R202 Paresthesia of skin: Secondary | ICD-10-CM | POA: Insufficient documentation

## 2023-03-07 DIAGNOSIS — M5416 Radiculopathy, lumbar region: Secondary | ICD-10-CM

## 2023-03-07 DIAGNOSIS — M5412 Radiculopathy, cervical region: Secondary | ICD-10-CM | POA: Diagnosis not present

## 2023-03-22 ENCOUNTER — Ambulatory Visit: Payer: 59 | Attending: Physician Assistant

## 2023-03-22 DIAGNOSIS — R262 Difficulty in walking, not elsewhere classified: Secondary | ICD-10-CM | POA: Diagnosis not present

## 2023-03-22 DIAGNOSIS — R200 Anesthesia of skin: Secondary | ICD-10-CM | POA: Diagnosis not present

## 2023-03-22 DIAGNOSIS — M5431 Sciatica, right side: Secondary | ICD-10-CM | POA: Diagnosis not present

## 2023-03-22 DIAGNOSIS — M6281 Muscle weakness (generalized): Secondary | ICD-10-CM | POA: Insufficient documentation

## 2023-03-22 DIAGNOSIS — R202 Paresthesia of skin: Secondary | ICD-10-CM | POA: Diagnosis not present

## 2023-03-22 DIAGNOSIS — M542 Cervicalgia: Secondary | ICD-10-CM | POA: Diagnosis not present

## 2023-03-22 DIAGNOSIS — M5412 Radiculopathy, cervical region: Secondary | ICD-10-CM | POA: Diagnosis not present

## 2023-03-22 DIAGNOSIS — M5459 Other low back pain: Secondary | ICD-10-CM | POA: Diagnosis not present

## 2023-03-22 DIAGNOSIS — M5416 Radiculopathy, lumbar region: Secondary | ICD-10-CM | POA: Diagnosis not present

## 2023-03-22 DIAGNOSIS — M5432 Sciatica, left side: Secondary | ICD-10-CM | POA: Insufficient documentation
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
## 2023-03-29 ENCOUNTER — Ambulatory Visit: Payer: 59

## 2023-03-30 ENCOUNTER — Ambulatory Visit: Payer: 59 | Attending: Physician Assistant

## 2023-03-30 DIAGNOSIS — M6281 Muscle weakness (generalized): Secondary | ICD-10-CM | POA: Insufficient documentation

## 2023-03-30 DIAGNOSIS — M542 Cervicalgia: Secondary | ICD-10-CM | POA: Insufficient documentation

## 2023-03-30 DIAGNOSIS — M5459 Other low back pain: Secondary | ICD-10-CM | POA: Insufficient documentation
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
## 2023-03-31 ENCOUNTER — Encounter: Payer: Self-pay | Admitting: Physician Assistant

## 2023-04-01 ENCOUNTER — Ambulatory Visit: Payer: 59

## 2023-04-01 DIAGNOSIS — M5459 Other low back pain: Secondary | ICD-10-CM | POA: Diagnosis not present

## 2023-04-01 DIAGNOSIS — M6281 Muscle weakness (generalized): Secondary | ICD-10-CM | POA: Diagnosis not present

## 2023-04-01 DIAGNOSIS — M542 Cervicalgia: Secondary | ICD-10-CM | POA: Diagnosis not present
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
## 2023-04-04 ENCOUNTER — Ambulatory Visit
Admission: RE | Admit: 2023-04-04 | Discharge: 2023-04-04 | Disposition: A | Discharge status: Home / Self Care | Payer: 59 | Source: Ambulatory Visit | Attending: Physician Assistant | Admitting: Physician Assistant

## 2023-04-04 ENCOUNTER — Ambulatory Visit: Payer: 59

## 2023-04-04 DIAGNOSIS — R200 Anesthesia of skin: Secondary | ICD-10-CM

## 2023-04-04 DIAGNOSIS — M5412 Radiculopathy, cervical region: Secondary | ICD-10-CM | POA: Diagnosis not present

## 2023-04-04 MED ORDER — IOPAMIDOL (ISOVUE-370) INJECTION 76%
75.0000 mL | Freq: Once | INTRAVENOUS | Status: AC | PRN
  Administered 2023-04-04: 75 mL via INTRAVENOUS

## 2023-04-06 ENCOUNTER — Ambulatory Visit: Payer: 59

## 2023-04-06 DIAGNOSIS — M542 Cervicalgia: Secondary | ICD-10-CM | POA: Diagnosis not present

## 2023-04-06 DIAGNOSIS — M5459 Other low back pain: Secondary | ICD-10-CM

## 2023-04-06 DIAGNOSIS — M6281 Muscle weakness (generalized): Secondary | ICD-10-CM
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
## 2023-04-08 ENCOUNTER — Ambulatory Visit: Payer: 59

## 2023-04-09 ENCOUNTER — Inpatient Hospital Stay: Admission: RE | Admit: 2023-04-09 | Payer: 59 | Source: Ambulatory Visit

## 2023-04-09 ENCOUNTER — Other Ambulatory Visit: Payer: 59

## 2023-04-12 ENCOUNTER — Ambulatory Visit: Payer: 59

## 2023-04-14 ENCOUNTER — Ambulatory Visit: Payer: 59

## 2023-04-16 ENCOUNTER — Ambulatory Visit
Admission: RE | Admit: 2023-04-16 | Discharge: 2023-04-16 | Disposition: A | Discharge status: Home / Self Care | Payer: 59 | Source: Ambulatory Visit | Attending: Physician Assistant | Admitting: Physician Assistant

## 2023-04-16 DIAGNOSIS — M5416 Radiculopathy, lumbar region: Secondary | ICD-10-CM

## 2023-04-16 DIAGNOSIS — M5412 Radiculopathy, cervical region: Secondary | ICD-10-CM

## 2023-04-16 DIAGNOSIS — R200 Anesthesia of skin: Secondary | ICD-10-CM

## 2023-04-16 DIAGNOSIS — M545 Low back pain, unspecified: Secondary | ICD-10-CM | POA: Diagnosis not present

## 2023-04-19 ENCOUNTER — Ambulatory Visit: Payer: 59

## 2023-04-28 ENCOUNTER — Telehealth: Payer: Self-pay

## 2023-04-28 ENCOUNTER — Ambulatory Visit: Payer: 59 | Attending: Physician Assistant

## 2023-04-28 DIAGNOSIS — M5459 Other low back pain: Secondary | ICD-10-CM | POA: Insufficient documentation

## 2023-04-28 DIAGNOSIS — M6281 Muscle weakness (generalized): Secondary | ICD-10-CM | POA: Insufficient documentation

## 2023-04-28 DIAGNOSIS — M542 Cervicalgia: Secondary | ICD-10-CM | POA: Insufficient documentation

## 2023-04-28 DIAGNOSIS — R262 Difficulty in walking, not elsewhere classified: Secondary | ICD-10-CM | POA: Insufficient documentation

## 2023-04-28 NOTE — Telephone Encounter (Signed)
 No show. Called patient and left a message pertaining to appointment and a reminder for the next follow up session. Return phone call requested. Phone number [phone]) provided.

## 2023-05-03 ENCOUNTER — Ambulatory Visit: Payer: 59

## 2023-05-03 ENCOUNTER — Telehealth: Payer: Self-pay

## 2023-05-03 NOTE — Telephone Encounter (Signed)
No show. Called patient pertaining to today's scheduled appointment. Left a message pertaining to today's appointment, the no show and cancellation policy and due to multiple no shows and multiple cancellations, we have to remove him from the schedule per policy.

## 2023-05-05 ENCOUNTER — Ambulatory Visit: Payer: 59

## 2023-05-05 DIAGNOSIS — M5459 Other low back pain: Secondary | ICD-10-CM

## 2023-05-05 DIAGNOSIS — M6281 Muscle weakness (generalized): Secondary | ICD-10-CM | POA: Diagnosis not present

## 2023-05-05 DIAGNOSIS — R262 Difficulty in walking, not elsewhere classified: Secondary | ICD-10-CM | POA: Diagnosis not present

## 2023-05-05 DIAGNOSIS — M542 Cervicalgia: Secondary | ICD-10-CM | POA: Diagnosis not present
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
## 2023-05-10 ENCOUNTER — Ambulatory Visit: Payer: 59

## 2023-05-13 ENCOUNTER — Ambulatory Visit: Payer: 59

## 2023-05-23 ENCOUNTER — Ambulatory Visit: Payer: 59

## 2023-05-24 ENCOUNTER — Telehealth: Payer: Self-pay

## 2023-05-24 ENCOUNTER — Ambulatory Visit
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
## 2023-05-25 ENCOUNTER — Ambulatory Visit: Payer: Self-pay | Admitting: Family Medicine

## 2023-05-30 ENCOUNTER — Ambulatory Visit: Payer: Self-pay | Admitting: General Practice
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
## 2023-06-30 ENCOUNTER — Ambulatory Visit: Attending: Physician Assistant

## 2023-06-30 DIAGNOSIS — M5432 Sciatica, left side: Secondary | ICD-10-CM | POA: Insufficient documentation

## 2023-06-30 DIAGNOSIS — M5431 Sciatica, right side: Secondary | ICD-10-CM | POA: Insufficient documentation

## 2023-06-30 DIAGNOSIS — M6281 Muscle weakness (generalized): Secondary | ICD-10-CM | POA: Insufficient documentation

## 2023-06-30 DIAGNOSIS — R262 Difficulty in walking, not elsewhere classified: Secondary | ICD-10-CM | POA: Insufficient documentation

## 2023-06-30 DIAGNOSIS — M542 Cervicalgia: Secondary | ICD-10-CM | POA: Insufficient documentation

## 2023-06-30 DIAGNOSIS — M5459 Other low back pain: Secondary | ICD-10-CM | POA: Insufficient documentation

## 2023-07-05 ENCOUNTER — Ambulatory Visit

## 2023-07-05 DIAGNOSIS — M542 Cervicalgia: Secondary | ICD-10-CM | POA: Diagnosis not present

## 2023-07-05 DIAGNOSIS — M5432 Sciatica, left side: Secondary | ICD-10-CM

## 2023-07-05 DIAGNOSIS — R262 Difficulty in walking, not elsewhere classified: Secondary | ICD-10-CM

## 2023-07-05 DIAGNOSIS — M6281 Muscle weakness (generalized): Secondary | ICD-10-CM | POA: Diagnosis not present

## 2023-07-05 DIAGNOSIS — M5431 Sciatica, right side: Secondary | ICD-10-CM

## 2023-07-05 DIAGNOSIS — M5459 Other low back pain: Secondary | ICD-10-CM | POA: Diagnosis not present
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
## 2023-07-26 ENCOUNTER — Encounter

## 2023-08-11 DIAGNOSIS — R531 Weakness: Secondary | ICD-10-CM | POA: Diagnosis not present

## 2023-08-11 DIAGNOSIS — R030 Elevated blood-pressure reading, without diagnosis of hypertension: Secondary | ICD-10-CM | POA: Diagnosis not present

## 2023-08-11 DIAGNOSIS — T733XXA Exhaustion due to excessive exertion, initial encounter: Secondary | ICD-10-CM | POA: Diagnosis not present

## 2023-08-17 ENCOUNTER — Ambulatory Visit: Admitting: Family Medicine

## 2023-09-11 ENCOUNTER — Encounter: Payer: Self-pay | Admitting: Emergency Medicine

## 2023-09-11 ENCOUNTER — Other Ambulatory Visit: Payer: Self-pay

## 2023-09-11 ENCOUNTER — Emergency Department
Admission: EM | Admit: 2023-09-11 | Discharge: 2023-09-11 | Disposition: A | Discharge status: Home / Self Care | Attending: Emergency Medicine | Admitting: Emergency Medicine

## 2023-09-11 DIAGNOSIS — Z720 Tobacco use: Secondary | ICD-10-CM | POA: Insufficient documentation

## 2023-09-11 DIAGNOSIS — R051 Acute cough: Secondary | ICD-10-CM | POA: Diagnosis not present

## 2023-09-11 DIAGNOSIS — R110 Nausea: Secondary | ICD-10-CM | POA: Diagnosis not present

## 2023-09-11 DIAGNOSIS — R112 Nausea with vomiting, unspecified: Secondary | ICD-10-CM | POA: Diagnosis not present

## 2023-09-11 DIAGNOSIS — R6883 Chills (without fever): Secondary | ICD-10-CM | POA: Diagnosis not present

## 2023-09-11 LAB — RESP PANEL BY RT-PCR (RSV, FLU A&B, COVID)  RVPGX2
Influenza A by PCR: NEGATIVE
Influenza B by PCR: NEGATIVE
Resp Syncytial Virus by PCR: NEGATIVE
SARS Coronavirus 2 by RT PCR: NEGATIVE

## 2023-09-11 LAB — GROUP A STREP BY PCR: Group A Strep by PCR: NOT DETECTED

## 2023-09-11 MED ORDER — ONDANSETRON 4 MG PO TBDP
4.0000 mg | ORAL_TABLET | Freq: Once | ORAL | Status: AC
  Administered 2023-09-11: 4 mg via ORAL
  Filled 2023-09-11: qty 1

## 2023-09-11 MED ORDER — ONDANSETRON 4 MG PO TBDP: 4.0000 mg | ORAL_TABLET | Freq: Three times a day (TID) | ORAL | 0 refills | Status: DC | PRN

## 2023-09-11 NOTE — ED Provider Notes (Signed)
 [facility] Provider Note    Event Date/Time   First MD Initiated Contact with Patient 09/11/23 1732     (approximate)   History   Emesis and Chills   HPI  George Barrera is a 33 y.o. male past medical history significant for tobacco use who presents to the emergency department for not feeling well with nausea and vomiting.  States that he started having some chills yesterday.  Today had a couple of episodes of vomiting.  Denies any significant abdominal pain.  No fever.  No significant diarrhea.  No dysuria, urinary urgency or frequency.  Denies any chest pain or shortness of breath.  Does endorse an mild cough over the past 2 or 3 days.  Denies any alcohol use.  Denies marijuana use.  States that he has a primary care appointment scheduled for 3 days.     Physical Exam   Triage Vital Signs: ED Triage Vitals  Encounter Vitals Group     BP --      Girls Systolic BP Percentile --      Girls Diastolic BP Percentile --      Boys Systolic BP Percentile --      Boys Diastolic BP Percentile --      Pulse --      Resp --      Temp --      Temp src --      SpO2 --      Weight 09/11/23 1654 145 lb (65.8 kg)     Height 09/11/23 1654 5' 9 (1.753 m)     Head Circumference --      Peak Flow --      Pain Score 09/11/23 1653 6     Pain Loc --      Pain Education --      Exclude from Growth Chart --     Most recent vital signs: Vitals:   09/11/23 1746  BP: 129/70  Pulse: 79  Resp: 18  Temp: 98.3 F (36.8 C)  SpO2: 100%    Physical Exam Constitutional:      Appearance: He is well-developed.  HENT:     Head: Atraumatic.   Eyes:     Conjunctiva/sclera: Conjunctivae normal.    Cardiovascular:     Rate and Rhythm: Regular rhythm.     Pulses: Normal pulses.  Pulmonary:     Effort: No respiratory distress.     Breath sounds: Normal breath sounds. No wheezing.  Abdominal:     Tenderness: There is no abdominal tenderness. There is no right  CVA tenderness, left CVA tenderness, guarding or rebound.     Comments: Negative McBurney's point, negative Murphy sign, no abdominal tenderness to palpation   Musculoskeletal:        General: Normal range of motion.     Cervical back: Normal range of motion.     Right lower leg: No edema.     Left lower leg: No edema.   Skin:    General: Skin is warm.     Capillary Refill: Capillary refill takes less than 2 seconds.   Neurological:     Mental Status: He is alert. Mental status is at baseline.      IMPRESSION / MDM / ASSESSMENT AND PLAN / ED COURSE  I reviewed the triage vital signs and the nursing notes.  Clinical picture is most concerning for viral illness.  Lungs were clear to auscultation with no focal findings consistent with pneumonia.  Abdomen was nontender to palpation.  No right upper quadrant abdominal tenderness have low suspicion for acute cholecystitis or symptomatic cholelithiasis.  No right lower quadrant or lower abdominal tenderness to palpation have low suspicion for acute appendicitis.  No alcohol use, doubt pancreatitis.  Patient is otherwise well-appearing and does not appear clinically dehydrated   Labs (all labs ordered are listed, but only abnormal results are displayed) Labs interpreted as -    Labs Reviewed  RESP PANEL BY RT-PCR (RSV, FLU A&B, COVID)  [id number]  GROUP A STREP BY PCR    Most likely with viral illness.  Given antiemetics.  Discussed ibuprofen  and Tylenol  for chills and pain control.  Patient has an appointment with primary care in 3 days.  Discussed return for any ongoing or worsening symptoms.  Discussed smoking cessation.  No questions at time of discharge.     PROCEDURES:  Critical Care performed: No  Procedures  Patient's presentation is most consistent with acute complicated illness / injury requiring diagnostic workup.   MEDICATIONS ORDERED IN ED: Medications  ondansetron  (ZOFRAN -ODT) disintegrating tablet 4 mg (has no  administration in time range)    FINAL CLINICAL IMPRESSION(S) / ED DIAGNOSES   Final diagnoses:  Nausea  Chills  Acute cough     Rx / DC Orders   ED Discharge Orders          Ordered    ondansetron  (ZOFRAN -ODT) 4 MG disintegrating tablet  Every 8 hours PRN        09/11/23 1741             Note:  This document was prepared using Dragon voice recognition software and may include unintentional dictation errors.   Suzanne Kirsch, MD 09/11/23 [phone]

## 2023-09-11 NOTE — Discharge Instructions (Addendum)
 You are seen in the emergency department for nausea and vomiting and not feeling well.  Your vital signs were stable.  Your lungs sounded clear and did not have any findings concerning for pneumonia.  Your COVID test, influenza test, RSV and strep testing were all negative.  You are given a prescription for nausea medication.  Follow-up closely with your primary care physician and return to the emergency department for any ongoing or worsening symptoms.  Stay hydrated and drink plenty of fluids.  You can use Pedialyte, Gatorade or Powerade to help hydrate.  Pain control:  Ibuprofen  (motrin /aleve /advil ) - You can take 3 tablets (600 mg) every 6 hours as needed for pain/fever.  Acetaminophen  (tylenol ) - You can take 2 extra strength tablets (1000 mg) every 6 hours as needed for pain/fever.  You can alternate these medications or take them together.  Make sure you eat food/drink water when taking these medications.  zofran  (ondansetron ) - nausea medication, take 1 tablet every 8 hours as needed for nausea/vomiting.

## 2023-09-11 NOTE — ED Triage Notes (Signed)
 Pt c/o flu symptoms with n/v, chills, body aches, indigestion, first noticed yesterday but worse today. 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

## 2023-09-14 ENCOUNTER — Ambulatory Visit: Admitting: Family Medicine

## 2023-11-02 ENCOUNTER — Ambulatory Visit (INDEPENDENT_AMBULATORY_CARE_PROVIDER_SITE_OTHER): Admitting: Family Medicine

## 2023-11-02 ENCOUNTER — Encounter: Payer: Self-pay | Admitting: Family Medicine

## 2023-11-02 VITALS — BP 133/84 | HR 85 | Resp 16 | Ht 70.0 in

## 2023-11-02 DIAGNOSIS — M79641 Pain in right hand: Secondary | ICD-10-CM

## 2023-11-02 DIAGNOSIS — F419 Anxiety disorder, unspecified: Secondary | ICD-10-CM | POA: Diagnosis not present

## 2023-11-02 DIAGNOSIS — F1021 Alcohol dependence, in remission: Secondary | ICD-10-CM

## 2023-11-02 DIAGNOSIS — G8929 Other chronic pain: Secondary | ICD-10-CM

## 2023-11-02 DIAGNOSIS — R030 Elevated blood-pressure reading, without diagnosis of hypertension: Secondary | ICD-10-CM

## 2023-11-02 DIAGNOSIS — Z9109 Other allergy status, other than to drugs and biological substances: Secondary | ICD-10-CM | POA: Diagnosis not present

## 2023-11-02 DIAGNOSIS — Z7689 Persons encountering health services in other specified circumstances: Secondary | ICD-10-CM | POA: Diagnosis not present

## 2023-11-02 DIAGNOSIS — Z833 Family history of diabetes mellitus: Secondary | ICD-10-CM | POA: Diagnosis not present

## 2023-11-02 DIAGNOSIS — F32A Depression, unspecified: Secondary | ICD-10-CM | POA: Diagnosis not present

## 2023-11-02 DIAGNOSIS — Z113 Encounter for screening for infections with a predominantly sexual mode of transmission: Secondary | ICD-10-CM
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
## 2023-11-03 DIAGNOSIS — J069 Acute upper respiratory infection, unspecified: Secondary | ICD-10-CM | POA: Diagnosis not present

## 2023-11-03 DIAGNOSIS — Z20822 Contact with and (suspected) exposure to covid-19: Secondary | ICD-10-CM | POA: Diagnosis not present

## 2023-11-04 ENCOUNTER — Ambulatory Visit: Payer: Self-pay | Admitting: Family Medicine

## 2023-11-04 DIAGNOSIS — R718 Other abnormality of red blood cells: Secondary | ICD-10-CM

## 2023-11-04 LAB — COMPREHENSIVE METABOLIC PANEL WITH GFR
ALT: 12 IU/L (ref 0–44)
AST: 22 IU/L (ref 0–40)
Albumin: 4.6 g/dL (ref 4.1–5.1)
Alkaline Phosphatase: 81 IU/L (ref 44–121)
BUN/Creatinine Ratio: 6 — ABNORMAL LOW (ref 9–20)
BUN: 7 mg/dL (ref 6–20)
Bilirubin Total: 0.7 mg/dL (ref 0.0–1.2)
CO2: 24 mmol/L (ref 20–29)
Calcium: 10.1 mg/dL (ref 8.7–10.2)
Chloride: 100 mmol/L (ref 96–106)
Creatinine, Ser: 1.15 mg/dL (ref 0.76–1.27)
Globulin, Total: 3 g/dL (ref 1.5–4.5)
Glucose: 78 mg/dL (ref 70–99)
Potassium: 4.1 mmol/L (ref 3.5–5.2)
Sodium: 138 mmol/L (ref 134–144)
Total Protein: 7.6 g/dL (ref 6.0–8.5)
eGFR: 87 mL/min/1.73 (ref >59)

## 2023-11-04 LAB — CBC WITH DIFFERENTIAL/PLATELET
Basophils Absolute: 0 x10E3/uL (ref 0.0–0.2)
Basos: 0 %
EOS (ABSOLUTE): 0 x10E3/uL (ref 0.0–0.4)
Eos: 0 %
Hematocrit: 49.5 % (ref 37.5–51.0)
Hemoglobin: 15.8 g/dL (ref 13.0–17.7)
Immature Grans (Abs): 0 x10E3/uL (ref 0.0–0.1)
Immature Granulocytes: 0 %
Lymphocytes Absolute: 3.5 x10E3/uL — ABNORMAL HIGH (ref 0.7–3.1)
Lymphs: 33 %
MCH: 23.9 pg — ABNORMAL LOW (ref 26.6–33.0)
MCHC: 31.9 g/dL (ref 31.5–35.7)
MCV: 75 fL — ABNORMAL LOW (ref 79–97)
Monocytes Absolute: 1.3 x10E3/uL — ABNORMAL HIGH (ref 0.1–0.9)
Monocytes: 12 %
Neutrophils Absolute: 5.9 x10E3/uL (ref 1.4–7.0)
Neutrophils: 55 %
Platelets: 360 x10E3/uL (ref 150–450)
RBC: 6.6 x10E6/uL — ABNORMAL HIGH (ref 4.14–5.80)
RDW: 17.5 % — ABNORMAL HIGH (ref 11.6–15.4)
WBC: 10.7 x10E3/uL (ref 3.4–10.8)

## 2023-11-04 LAB — HIV ANTIBODY (ROUTINE TESTING W REFLEX): HIV Screen 4th Generation wRfx: NONREACTIVE

## 2023-11-04 LAB — RPR, QUANT+TP ABS (REFLEX)
Rapid Plasma Reagin, Quant: 1:2 {titer} — ABNORMAL HIGH
T Pallidum Abs: REACTIVE — AB

## 2023-11-04 LAB — HEMOGLOBIN A1C
Est. average glucose Bld gHb Est-mCnc: 111 mg/dL
Hgb A1c MFr Bld: 5.5 % (ref 4.8–5.6)

## 2023-11-04 LAB — LIPID PANEL
Chol/HDL Ratio: 2 ratio (ref 0.0–5.0)
Cholesterol, Total: 118 mg/dL (ref 100–199)
HDL: 59 mg/dL (ref >39)
LDL Chol Calc (NIH): 48 mg/dL (ref 0–99)
Triglycerides: 41 mg/dL (ref 0–149)
VLDL Cholesterol Cal: 11 mg/dL (ref 5–40)

## 2023-11-04 LAB — RPR: RPR Ser Ql: REACTIVE — AB

## 2023-11-04 LAB — HEPATITIS C ANTIBODY: Hep C Virus Ab: NONREACTIVE

## 2023-11-04 NOTE — Progress Notes (Signed)
 Thank you  for update

## 2023-11-11 ENCOUNTER — Other Ambulatory Visit: Payer: Self-pay

## 2023-11-11 ENCOUNTER — Emergency Department

## 2023-11-11 ENCOUNTER — Emergency Department
Admission: EM | Admit: 2023-11-11 | Discharge: 2023-11-11 | Disposition: A | Discharge status: Home / Self Care | Attending: Emergency Medicine | Admitting: Emergency Medicine

## 2023-11-11 DIAGNOSIS — W230XXA Caught, crushed, jammed, or pinched between moving objects, initial encounter: Secondary | ICD-10-CM | POA: Diagnosis not present

## 2023-11-11 DIAGNOSIS — S6991XA Unspecified injury of right wrist, hand and finger(s), initial encounter: Secondary | ICD-10-CM | POA: Diagnosis not present

## 2023-11-11 DIAGNOSIS — S62356A Nondisplaced fracture of shaft of fifth metacarpal bone, right hand, initial encounter for closed fracture: Secondary | ICD-10-CM | POA: Diagnosis not present

## 2023-11-11 DIAGNOSIS — Y92810 Car as the place of occurrence of the external cause: Secondary | ICD-10-CM | POA: Diagnosis not present

## 2023-11-11 MED ORDER — OXYCODONE HCL 5 MG PO TABS: 5.0000 mg | ORAL_TABLET | Freq: Three times a day (TID) | ORAL | 0 refills | Status: AC | PRN

## 2023-11-11 MED ORDER — ONDANSETRON 4 MG PO TBDP: 4.0000 mg | ORAL_TABLET | Freq: Four times a day (QID) | ORAL | 0 refills | Status: AC | PRN

## 2023-11-11 MED ORDER — OXYCODONE-ACETAMINOPHEN 5-325 MG PO TABS
1.0000 | ORAL_TABLET | Freq: Once | ORAL | Status: AC
  Administered 2023-11-11: 1 via ORAL
  Filled 2023-11-11: qty 1

## 2023-11-11 MED ORDER — ONDANSETRON 4 MG PO TBDP
4.0000 mg | ORAL_TABLET | Freq: Once | ORAL | Status: AC
  Administered 2023-11-11: 4 mg via ORAL
  Filled 2023-11-11: qty 1

## 2023-11-11 MED ORDER — IBUPROFEN 800 MG PO TABS
800.0000 mg | ORAL_TABLET | Freq: Once | ORAL | Status: AC
  Administered 2023-11-11: 800 mg via ORAL
  Filled 2023-11-11: qty 1

## 2023-11-11 MED ORDER — IBUPROFEN 800 MG PO TABS: 800.0000 mg | ORAL_TABLET | Freq: Three times a day (TID) | ORAL | 0 refills | Status: AC | PRN
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
## 2023-11-11 NOTE — Discharge Instructions (Addendum)

## 2023-11-11 NOTE — ED Provider Notes (Signed)
 [facility] Provider Note    Event Date/Time   First MD Initiated Contact with Patient 11/11/23 0335     (approximate)   History   Hand Pain   HPI  George Barrera is a 33 y.o. male right-hand-dominant who presents to the emergency department with right hand injury after he states that he got his hand caught in a car door when it closed.  Pain and swelling over the fifth metacarpal.  Decreased range of motion in the 4th and 5th fingers due to pain.  Normal sensation.  No other injury.  Patient works as a Financial risk analyst.   History provided by patient.    Past Medical History:  Diagnosis Date   Allergy    Anxiety    Depression     No past surgical history on file.  MEDICATIONS:  Prior to Admission medications   Medication Sig Start Date End Date Taking? Authorizing Provider  promethazine  (PHENERGAN ) 12.5 MG tablet Take 1 tablet (12.5 mg total) by mouth every 8 (eight) hours as needed for nausea or vomiting. 12/29/14 09/30/15  Charlene Debby BROCKS, PA-C    Physical Exam   Triage Vital Signs: ED Triage Vitals  Encounter Vitals Group     BP 11/11/23 0152 (!) 149/95     Girls Systolic BP Percentile --      Girls Diastolic BP Percentile --      Boys Systolic BP Percentile --      Boys Diastolic BP Percentile --      Pulse Rate 11/11/23 0152 (!) 107     Resp 11/11/23 0152 18     Temp 11/11/23 0152 98.2 F (36.8 C)     Temp Source 11/11/23 0152 Oral     SpO2 11/11/23 0152 100 %     Weight 11/11/23 0153 142 lb (64.4 kg)     Height 11/11/23 0153 5' 10 (1.778 m)     Head Circumference --      Peak Flow --      Pain Score 11/11/23 0153 8     Pain Loc --      Pain Education --      Exclude from Growth Chart --     Most recent vital signs: Vitals:   11/11/23 0152  BP: (!) 149/95  Pulse: (!) 107  Resp: 18  Temp: 98.2 F (36.8 C)  SpO2: 100%     CONSTITUTIONAL: Alert and responds appropriately to questions. Well-appearing; well-nourished HEAD:  Normocephalic, atraumatic EYES: Conjunctivae clear, pupils appear equal ENT: normal nose; moist mucous membranes NECK: Normal range of motion CARD: Regular rate and rhythm RESP: Normal chest excursion without splinting or tachypnea; no hypoxia or respiratory distress, speaking full sentences ABD/GI: non-distended EXT: Normal ROM in all joints, no major deformities noted SKIN: Patient has soft tissue swelling, bruising and tenderness to the right 4th and 5th metacarpals.  2+ right radial pulse.  Compartments of the right arm are soft.  Normal capillary refill.  Decreased range of motion in the right 4th and 5th fingers due to pain NEURO: Moves all extremities equally, normal speech, no facial asymmetry noted PSYCH: The patient's mood and manner are appropriate. Grooming and personal hygiene are appropriate.  ED Results / Procedures / Treatments   LABS: (all labs ordered are listed, but only abnormal results are displayed) Labs Reviewed - No data to display   EKG:  EKG Interpretation Date/Time:    Ventricular Rate:    PR Interval:    QRS  Duration:    QT Interval:    QTC Calculation:   R Axis:      Text Interpretation:            RADIOLOGY: My personal review and interpretation of imaging: Patient has a nondisplaced fracture of the shaft of the fifth metacarpal.  I have personally reviewed all radiology reports. DG Hand Complete Right Result Date: 11/11/2023 CLINICAL DATA:  Hand deformity and pain after slammed in a car door EXAM: RIGHT HAND - COMPLETE 3+ VIEW COMPARISON:  04/02/2012 FINDINGS: Acute nondisplaced fracture of the fifth metacarpal diaphysis. Adjacent soft tissue swelling. IMPRESSION: 1. Acute nondisplaced fracture of the fifth metacarpal diaphysis. Electronically Signed   By: Norman Gatlin M.D.   On: 11/11/2023 02:24     PROCEDURES:  Critical Care performed: No    SPLINT APPLICATION Date/Time: 4:53 AM Authorized by: Josette Quan Cybulski Consent: Verbal  consent obtained. Risks and benefits: risks, benefits and alternatives were discussed Consent given by: patient Splint applied by: technician Location details: Right arm Splint type: Ulnar gutter Supplies used: Ortho-Glass Post-procedure: The splinted body part was neurovascularly unchanged following the procedure. Patient tolerance: Patient tolerated the procedure well with no immediate complications.     Procedures    IMPRESSION / MDM / ASSESSMENT AND PLAN / ED COURSE  I reviewed the triage vital signs and the nursing notes.   Patient here with right hand injury.     DIFFERENTIAL DIAGNOSIS (includes but not limited to):   Fracture, contusion, dislocation  Patient's presentation is most consistent with acute complicated illness / injury requiring diagnostic workup.  PLAN: X-ray obtained from triage and reviewed/interpreted by myself and the radiologist and shows a nondisplaced fifth metacarpal fracture.  He is neurovascular intact distally.  Will place him in an ulnar gutter splint, provide him with a work note as he will be nonweightbearing with his right upper extremity for at least the next 4 weeks.  Discussed with patient that he will need to follow-up with orthopedics and may need surgical intervention as an outpatient.  Will discharge with prescriptions for pain medication.   MEDICATIONS GIVEN IN ED: Medications  ibuprofen  (ADVIL ) tablet 800 mg (800 mg Oral Given 11/11/23 0403)  oxyCODONE -acetaminophen  (PERCOCET/ROXICET) 5-325 MG per tablet 1 tablet (1 tablet Oral Given 11/11/23 0403)  ondansetron  (ZOFRAN -ODT) disintegrating tablet 4 mg (4 mg Oral Given 11/11/23 0403)     ED COURSE:  At this time, I do not feel there is any life-threatening condition present. I reviewed all nursing notes, vitals, pertinent previous records.  All lab and urine results, EKGs, imaging ordered have been independently reviewed and interpreted by myself.  I reviewed all available radiology  reports from any imaging ordered this visit.  Based on my assessment, I feel the patient is safe to be discharged home without further emergent workup and can continue workup as an outpatient as needed. Discussed all findings, treatment plan as well as usual and customary return precautions.  They verbalize understanding and are comfortable with this plan.  Outpatient follow-up has been provided as needed.  All questions have been answered.    CONSULTS:  none   OUTSIDE RECORDS REVIEWED: Reviewed family medicine note on 11/02/2023.     FINAL CLINICAL IMPRESSION(S) / ED DIAGNOSES   Final diagnoses:  Nondisplaced fracture of shaft of fifth metacarpal bone, right hand, initial encounter for closed fracture     Rx / DC Orders   ED Discharge Orders  Ordered    ibuprofen  (ADVIL ) 800 MG tablet  Every 8 hours PRN        11/11/23 0402    ondansetron  (ZOFRAN -ODT) 4 MG disintegrating tablet  Every 6 hours PRN        11/11/23 0402    oxyCODONE  (ROXICODONE ) 5 MG immediate release tablet  Every 8 hours PRN        11/11/23 0402             Note:  This document was prepared using Dragon voice recognition software and may include unintentional dictation errors.   Corrigan Kretschmer, Josette SAILOR, DO 11/11/23 [phone]

## 2023-11-14 DIAGNOSIS — M79641 Pain in right hand: Secondary | ICD-10-CM | POA: Diagnosis not present

## 2023-11-14 DIAGNOSIS — S62356A Nondisplaced fracture of shaft of fifth metacarpal bone, right hand, initial encounter for closed fracture: Secondary | ICD-10-CM | POA: Diagnosis not present

## 2023-11-16 ENCOUNTER — Inpatient Hospital Stay: Attending: Oncology | Admitting: Oncology

## 2023-11-16 ENCOUNTER — Other Ambulatory Visit

## 2023-11-16 ENCOUNTER — Encounter: Payer: Self-pay | Admitting: Oncology

## 2023-11-16 VITALS — BP 132/85 | HR 76 | Temp 97.0°F | Resp 17 | Wt 146.0 lb

## 2023-11-16 DIAGNOSIS — D751 Secondary polycythemia: Secondary | ICD-10-CM | POA: Insufficient documentation

## 2023-11-16 DIAGNOSIS — D509 Iron deficiency anemia, unspecified: Secondary | ICD-10-CM | POA: Diagnosis not present

## 2023-11-16 DIAGNOSIS — Z79899 Other long term (current) drug therapy: Secondary | ICD-10-CM | POA: Insufficient documentation

## 2023-11-16 DIAGNOSIS — F17210 Nicotine dependence, cigarettes, uncomplicated: Secondary | ICD-10-CM | POA: Insufficient documentation

## 2023-11-16 DIAGNOSIS — R718 Other abnormality of red blood cells: Secondary | ICD-10-CM

## 2023-11-16 NOTE — Progress Notes (Signed)
Patient here for initial oncology appointment, expresses concerns of fatigue and headaches

## 2023-11-16 NOTE — Progress Notes (Signed)
 Hematology/Oncology Consult note [facility] [phone] [phone]  Patient Care Team: Wellington Curtis LABOR, FNP as PCP - General (Family Medicine)   Name of the patient: George Barrera  [id number]  02/26/1991    Reason for referral- 1. Microcytosis 2. Mild polycythemia   Referring Nanci Wellington, FNP  Date of visit: 11/16/23   History of presenting illness-patient is a 33 year old male with a past medical history significant for Anxiety and depression referred for polycythemia and microcytosis.  On 11/02/2023 patient noted to have H&H of 15.8/49.5 with an MCV of 75 and elevated red blood cell mass of 6.6.  At baseline patient's hemoglobin has been between 15-17.  Patient has seen me back in 2022 for similar complaints.  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  HENT:  Negative for congestion, ear discharge and nosebleeds.   Eyes:  Negative for blurred vision.  Respiratory:  Negative for cough, hemoptysis, sputum production, shortness of breath and wheezing.   Cardiovascular:  Negative for chest pain, palpitations, orthopnea and claudication.  Gastrointestinal:  Negative for abdominal pain, blood in stool, constipation, diarrhea, heartburn, melena, nausea and vomiting.  Genitourinary:  Negative for dysuria, flank pain, frequency, hematuria and urgency.  Musculoskeletal:  Negative for back pain, joint pain and myalgias.  Skin:   Negative for rash.  Neurological:  Negative for dizziness, tingling, focal weakness, seizures, weakness and headaches.  Endo/Heme/Allergies:  Does not bruise/bleed easily.  Psychiatric/Behavioral:  Negative for depression and suicidal ideas. The patient does not have insomnia.     No Known Allergies  There are no active problems to display for this patient.    Past Medical History:  Diagnosis Date   Allergy    Anxiety    Depression      History reviewed. No pertinent surgical history.  Social History   Socioeconomic History   Marital status: Single    Spouse name: Not on file   Number of children: Not on file   Years of education: Not on file   Highest education level: Not on file  Occupational History   Not on file  Tobacco Use   Smoking status: Every Day    Current packs/day: 2.00    Types: Cigarettes   Smokeless tobacco: Never  Vaping Use   Vaping status: Never Used  Substance and Sexual Activity   Alcohol use: No   Drug use: No   Sexual activity: Not on file  Other Topics Concern   Not on file  Social History Narrative   Not on file   Social Drivers of Health   Financial Resource Strain: Low Risk  (11/02/2023)   Overall Financial Resource Strain (CARDIA)    Difficulty of Paying Living Expenses: Not very hard  Food Insecurity: No Food Insecurity (11/02/2023)   Hunger Vital Sign    Worried About Running Out of Food in the Last Year: Never true    Ran Out of Food in the Last Year: Never true  Transportation Needs: No Transportation Needs (11/02/2023)  PRAPARE - Administrator, Civil Service (Medical): No    Lack of Transportation (Non-Medical): No  Physical Activity: Sufficiently Active (11/02/2023)   Exercise Vital Sign    Days of Exercise per Week: 7 days    Minutes of Exercise per Session: 60 min  Stress: Stress Concern Present (11/02/2023)   [employer] of Occupational Health - Occupational Stress Questionnaire    Feeling of Stress:  To some extent  Social Connections: Unknown (12/11/2022)   Received from [facility]   Social Network    Social Network: Not on file  Intimate Partner Violence: Not At Risk (11/02/2023)   Humiliation, Afraid, Rape, and Kick questionnaire    Fear of Current or Ex-Partner: No    Emotionally Abused: No    Physically Abused: No    Sexually Abused: No     History reviewed. No pertinent family history.   Current Outpatient Medications:    acetaminophen  (TYLENOL ) 325 MG tablet, Take 650 mg by mouth every 6 (six) hours as needed., Disp: , Rfl:    ibuprofen  (ADVIL ) 800 MG tablet, Take 1 tablet (800 mg total) by mouth every 8 (eight) hours as needed., Disp: 30 tablet, Rfl: 0   ondansetron  (ZOFRAN -ODT) 4 MG disintegrating tablet, Take 1 tablet (4 mg total) by mouth every 6 (six) hours as needed for nausea or vomiting., Disp: 20 tablet, Rfl: 0   oxyCODONE  (ROXICODONE ) 5 MG immediate release tablet, Take 1 tablet (5 mg total) by mouth every 8 (eight) hours as needed., Disp: 15 tablet, Rfl: 0   Physical exam:  Vitals:   11/16/23 1446  BP: 132/85  Pulse: 76  Resp: 17  Temp: (!) 97 F (36.1 C)  TempSrc: Tympanic  SpO2: 99%  Weight: 146 lb (66.2 kg)   Physical Exam Cardiovascular:     Rate and Rhythm: Normal rate and regular rhythm.     Heart sounds: Normal heart sounds.  Pulmonary:     Effort: Pulmonary effort is normal.     Breath sounds: Normal breath sounds.  Abdominal:     General: Bowel sounds are normal.     Palpations: Abdomen is soft.     Comments: No palpable hepatosplenomegaly  Lymphadenopathy:     Comments: No palpable cervical, supraclavicular, axillary or inguinal adenopathy    Skin:    General: Skin is warm and dry.  Neurological:     Mental Status: He is alert and oriented to person, place, and time.           Latest Ref Rng & Units 11/02/2023    3:28 PM  CMP  Glucose 70 - 99 mg/dL 78   BUN 6 - 20 mg/dL 7   Creatinine 9.23 - 8.72 mg/dL 8.84   Sodium 865  - 855 mmol/L 138   Potassium 3.5 - 5.2 mmol/L 4.1   Chloride 96 - 106 mmol/L 100   CO2 20 - 29 mmol/L 24   Calcium 8.7 - 10.2 mg/dL 89.8   Total Protein 6.0 - 8.5 g/dL 7.6   Total Bilirubin 0.0 - 1.2 mg/dL 0.7   Alkaline Phos 44 - 121 IU/L 81   AST 0 - 40 IU/L 22   ALT 0 - 44 IU/L 12       Latest Ref Rng & Units 11/02/2023    3:28 PM  CBC  WBC 3.4 - 10.8 x10E3/uL 10.7   Hemoglobin 13.0 - 17.7 g/dL 84.1   Hematocrit 62.4 - 51.0 % 49.5   Platelets 150 -  450 x10E3/uL 360     No images are attached to the encounter.  DG Hand Complete Right Result Date: 11/11/2023 CLINICAL DATA:  Hand deformity and pain after slammed in a car door EXAM: RIGHT HAND - COMPLETE 3+ VIEW COMPARISON:  04/02/2012 FINDINGS: Acute nondisplaced fracture of the fifth metacarpal diaphysis. Adjacent soft tissue swelling. IMPRESSION: 1. Acute nondisplaced fracture of the fifth metacarpal diaphysis. Electronically Signed   By: Norman Gatlin M.D.   On: 11/11/2023 02:24    Assessment and plan- Patient is a 33 y.o. male referred for microcytosis and polycythemia  Patient has chronic microcytosis and elevated RBCSecondary to hemoglobin C trait.  He is asymptomatic from this and does not require any further workup or follow-up.  Polycythemia: Hemoglobin fluctuates between 15-17.  JAK2 and EPO mutation testing in the past has been negative.  This is likely secondary to his smoking.  Patient does not require any follow-up with me at this time   Thank you for this kind referral and the opportunity to participate in the care of this  Patient   Visit Diagnosis No diagnosis found.  Dr. Annah Skene, MD, MPH [facility] [phone] 11/16/2023

## 2023-11-17 DIAGNOSIS — S62356A Nondisplaced fracture of shaft of fifth metacarpal bone, right hand, initial encounter for closed fracture: Secondary | ICD-10-CM | POA: Diagnosis not present

## 2023-11-21 ENCOUNTER — Encounter: Payer: Self-pay | Admitting: Oncology

## 2023-11-24 DIAGNOSIS — S62356A Nondisplaced fracture of shaft of fifth metacarpal bone, right hand, initial encounter for closed fracture: Secondary | ICD-10-CM | POA: Diagnosis not present

## 2023-12-08 ENCOUNTER — Telehealth: Admitting: Oncology

## 2024-02-16 ENCOUNTER — Emergency Department (HOSPITAL_BASED_OUTPATIENT_CLINIC_OR_DEPARTMENT_OTHER)
Admission: EM | Admit: 2024-02-16 | Discharge: 2024-02-16 | Disposition: A | Discharge status: Home / Self Care | Attending: Emergency Medicine | Admitting: Emergency Medicine

## 2024-02-16 ENCOUNTER — Encounter (HOSPITAL_BASED_OUTPATIENT_CLINIC_OR_DEPARTMENT_OTHER): Payer: Self-pay | Admitting: Emergency Medicine

## 2024-02-16 DIAGNOSIS — J029 Acute pharyngitis, unspecified: Secondary | ICD-10-CM | POA: Insufficient documentation

## 2024-02-16 LAB — GROUP A STREP BY PCR: Group A Strep by PCR: NOT DETECTED

## 2024-02-16 MED ORDER — PREDNISONE 10 MG PO TABS: 20.0000 mg | ORAL_TABLET | Freq: Two times a day (BID) | ORAL | 0 refills | Status: AC

## 2024-02-16 MED ORDER — PREDNISONE 20 MG PO TABS
40.0000 mg | ORAL_TABLET | Freq: Once | ORAL | Status: AC
  Administered 2024-02-16: 40 mg via ORAL
  Filled 2024-02-16: qty 2

## 2024-02-16 MED ORDER — CEPHALEXIN 500 MG PO CAPS
500.0000 mg | ORAL_CAPSULE | Freq: Four times a day (QID) | ORAL | 0 refills | Status: AC
Start: 2024-02-16 — End: ?

## 2024-02-16 NOTE — ED Provider Notes (Signed)
 [place] EMERGENCY DEPARTMENT AT [facility] Provider Note   CSN: [id number] Arrival date & time: 02/16/24  2212     Patient presents with: Sore Throat   George Barrera is a 33 y.o. male.   Patient is a 33 year old male presenting with complaints of sore throat.  This has been ongoing for the past 3 days.  Pain is mainly to the right side of his throat.  Pain is worse with swallowing.  He denies any difficulty swallowing or breathing.  No fevers or chills.  Symptoms began after drinking after his dad and his dad was feeling unwell.  Patient is concerned he may have strep throat.       Prior to Admission medications   Medication Sig Start Date End Date Taking? Authorizing Provider  acetaminophen  (TYLENOL ) 325 MG tablet Take 650 mg by mouth every 6 (six) hours as needed.    [provider]  ibuprofen  (ADVIL ) 800 MG tablet Take 1 tablet (800 mg total) by mouth every 8 (eight) hours as needed. 11/11/23   Ward, Josette SAILOR, DO  ondansetron  (ZOFRAN -ODT) 4 MG disintegrating tablet Take 1 tablet (4 mg total) by mouth every 6 (six) hours as needed for nausea or vomiting. 11/11/23   Ward, Josette SAILOR, DO  oxyCODONE  (ROXICODONE ) 5 MG immediate release tablet Take 1 tablet (5 mg total) by mouth every 8 (eight) hours as needed. 11/11/23 11/10/24  Ward, Josette SAILOR, DO  promethazine  (PHENERGAN ) 12.5 MG tablet Take 1 tablet (12.5 mg total) by mouth every 8 (eight) hours as needed for nausea or vomiting. 12/29/14 09/30/15  Charlene Debby BROCKS, PA-C    Allergies: Patient has no known allergies.    Review of Systems  All other systems reviewed and are negative.   Updated Vital Signs BP (!) 146/96 (BP Location: Right Arm)   Pulse (!) 106   Temp 99.1 F (37.3 C) (Oral)   Resp 17   SpO2 97%   Physical Exam Vitals and nursing note reviewed.  Constitutional:      General: He is not in acute distress.    Appearance: He is well-developed. He is not diaphoretic.  HENT:     Head:  Normocephalic and atraumatic.     Mouth/Throat:     Mouth: Mucous membranes are moist.     Pharynx: Uvula midline. Posterior oropharyngeal erythema present. No oropharyngeal exudate.     Tonsils: No tonsillar exudate or tonsillar abscesses.  Cardiovascular:     Rate and Rhythm: Normal rate and regular rhythm.     Heart sounds: No murmur heard.    No friction rub.  Pulmonary:     Effort: Pulmonary effort is normal. No respiratory distress.     Breath sounds: Normal breath sounds. No wheezing or rales.  Musculoskeletal:        General: Normal range of motion.     Cervical back: Normal range of motion and neck supple.  Skin:    General: Skin is warm and dry.  Neurological:     Mental Status: He is alert and oriented to person, place, and time.     Coordination: Coordination normal.     (all labs ordered are listed, but only abnormal results are displayed) Labs Reviewed  GROUP A STREP BY PCR    EKG: None  Radiology: No results found.   Procedures   Medications Ordered in the ED  predniSONE  (DELTASONE ) tablet 40 mg (has no administration in time range)  Medical Decision Making Risk Prescription drug management.   Strep test negative.  Symptoms are most likely viral.  Patient to be treated with prednisone  and plenty of fluids.  To return as needed for any problems.  Patient is convinced he has strep throat.  I have agreed to prescribe an antibiotic which he can fill if he is not improving with the prednisone .     Final diagnoses:  None    ED Discharge Orders     None          Geroldine Berg, MD 02/16/24 2332

## 2024-02-16 NOTE — ED Triage Notes (Signed)
 Sore throat x 3 days  I might got strep throat Painful to swallow and talk- pain worse on right side

## 2024-02-16 NOTE — Discharge Instructions (Signed)
 Begin taking prednisone  as prescribed.  Take ibuprofen  600 mg every 6 hours as needed for pain.  Drink plenty of fluids and get plenty of rest.  If symptoms are not improving in the next 2 to 3 days, fill the prescription for Keflex  you have been given this evening.

## 2024-05-03 ENCOUNTER — Encounter: Admitting: Family Medicine
